# Patient Record
Sex: Male | Born: 1965 | Race: White | Hispanic: No | Marital: Married | Smoking: Former smoker
Health system: Southern US, Community
[De-identification: ages and names within clinical notes are randomized; demographics above are authoritative.]

## PROBLEM LIST (undated history)

## (undated) DIAGNOSIS — H9190 Unspecified hearing loss, unspecified ear: Secondary | ICD-10-CM

## (undated) HISTORY — DX: Unspecified hearing loss, unspecified ear: H91.90

---

## 2001-12-19 ENCOUNTER — Encounter: Payer: Self-pay | Admitting: Family Medicine

## 2001-12-19 ENCOUNTER — Encounter: Admission: RE | Admit: 2001-12-19 | Discharge: 2001-12-19 | Payer: Self-pay | Admitting: Family Medicine

## 2012-10-31 DIAGNOSIS — H9190 Unspecified hearing loss, unspecified ear: Secondary | ICD-10-CM

## 2012-10-31 HISTORY — DX: Unspecified hearing loss, unspecified ear: H91.90

## 2015-12-28 ENCOUNTER — Other Ambulatory Visit: Payer: Self-pay | Admitting: Neurosurgery

## 2015-12-28 DIAGNOSIS — M25512 Pain in left shoulder: Secondary | ICD-10-CM

## 2015-12-28 DIAGNOSIS — Z139 Encounter for screening, unspecified: Secondary | ICD-10-CM

## 2015-12-30 ENCOUNTER — Ambulatory Visit
Admission: RE | Admit: 2015-12-30 | Discharge: 2015-12-30 | Disposition: A | Discharge status: Home / Self Care | Payer: No Typology Code available for payment source | Source: Ambulatory Visit | Attending: Neurosurgery | Admitting: Neurosurgery

## 2015-12-30 ENCOUNTER — Other Ambulatory Visit: Payer: No Typology Code available for payment source

## 2015-12-30 DIAGNOSIS — M25512 Pain in left shoulder: Secondary | ICD-10-CM

## 2015-12-30 DIAGNOSIS — Z139 Encounter for screening, unspecified: Secondary | ICD-10-CM

## 2016-01-02 ENCOUNTER — Other Ambulatory Visit: Payer: Self-pay

## 2016-01-19 HISTORY — PX: SHOULDER ARTHROSCOPY: SHX128

## 2016-04-05 ENCOUNTER — Other Ambulatory Visit: Payer: Self-pay | Admitting: *Deleted

## 2016-04-05 ENCOUNTER — Encounter: Payer: Self-pay | Admitting: Family Medicine

## 2016-04-05 ENCOUNTER — Ambulatory Visit (INDEPENDENT_AMBULATORY_CARE_PROVIDER_SITE_OTHER): Payer: No Typology Code available for payment source | Admitting: Family Medicine

## 2016-04-05 VITALS — BP 132/88 | HR 96 | Temp 98.1°F | Ht 74.0 in | Wt 195.8 lb

## 2016-04-05 DIAGNOSIS — H9193 Unspecified hearing loss, bilateral: Secondary | ICD-10-CM | POA: Diagnosis not present

## 2016-04-05 DIAGNOSIS — Z1211 Encounter for screening for malignant neoplasm of colon: Secondary | ICD-10-CM

## 2016-04-05 DIAGNOSIS — K648 Other hemorrhoids: Secondary | ICD-10-CM | POA: Diagnosis not present

## 2016-04-05 DIAGNOSIS — K644 Residual hemorrhoidal skin tags: Secondary | ICD-10-CM | POA: Insufficient documentation

## 2016-04-05 DIAGNOSIS — H9190 Unspecified hearing loss, unspecified ear: Secondary | ICD-10-CM | POA: Insufficient documentation

## 2016-04-05 MED ORDER — HYDROCORTISONE 2.5 % RE CREA: 1.0000 "application " | TOPICAL_CREAM | Freq: Two times a day (BID) | RECTAL | Status: DC

## 2016-04-05 NOTE — Progress Notes (Signed)
BP 132/88 mmHg  Pulse 96  Temp(Src) 98.1 F (36.7 C)  Ht 6\' 2"  (1.88 m)  Wt 195 lb 12.8 oz (88.814 kg)  BMI 25.13 kg/m2  SpO2 98%   CC: new pt to establish  Subjective:    Patient ID: Duane Hudson, male    DOB: Apr 03, 1966, 50 y.o.   MRN: [id number]  HPI: Duane Hudson is a 50 y.o. male presenting on 04/05/2016 for Establish Care   Husband of Arsene Deshazier. No recent PCP or physical.   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   Recent shoulder arthroscopy for adhesive capsulitis L (12/2015, [place]). Takes meloxicam or ibuprofen for this.   Developing spot next to anus noticed a few weeks ago. Intermittently discomfort. H/o hemorrhoids in past, this feels similar but larger. No bleeding. Normally 1 BM QOD. Metamucil intermittently. No fevers/chills.  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 Interim medical history since our last visit reviewed. Allergies and medications reviewed and updated. No current outpatient prescriptions on file prior to visit.   No current facility-administered medications on file prior to visit.    Review of Systems Per HPI unless specifically indicated in ROS section     Objective:    BP 132/88 mmHg  Pulse 96  Temp(Src) 98.1 F (36.7 C)  Ht 6\' 2"  (1.88 m)  Wt 195 lb 12.8 oz (88.814 kg)  BMI 25.13 kg/m2  SpO2 98%  Wt Readings from Last 3 Encounters:  04/05/16 195 lb 12.8 oz (88.814 kg)    Physical Exam  Constitutional: He is oriented to person, place, and time. He appears well-developed  and well-nourished. No distress.  HENT:  Head: Normocephalic and atraumatic.  Mouth/Throat: Oropharynx is clear and moist. No oropharyngeal exudate.  Eyes: Conjunctivae and EOM are normal. Pupils are equal, round, and reactive to light. No scleral icterus.  Neck: Normal range of motion. Neck supple. No thyromegaly present.  Cardiovascular: Normal rate, regular rhythm, normal heart sounds and intact distal pulses.   No murmur heard. Pulmonary/Chest: Effort normal and breath sounds normal. No respiratory distress. He has no wheezes. He has no rales.  Genitourinary: Rectal exam shows external hemorrhoid. Rectal exam shows no fissure, no mass, no tenderness and anal tone normal.  R inferior column inflamed non thrombosed external hemorrhoid, not tender to palpation  Musculoskeletal: He exhibits no edema.  Lymphadenopathy:    He has no cervical adenopathy.  Neurological: He is alert and oriented to person, place, and time.  Skin: Skin is warm and dry. No rash noted.  Psychiatric: He has a normal mood and affect.  Nursing note and vitals reviewed.  No results found for this or any previous visit.    Assessment & Plan:   Problem List Items Addressed This Visit    Hard of hearing   External hemorrhoids without complication - Primary    Reviewed dx and treatment plan - anusol HC, sitz bath, metamucil and water/fiber in diet for goal 1 soft stool /day       Other Visit Diagnoses    Special screening for malignant  neoplasms, colon        Relevant Orders    Ambulatory referral to Gastroenterology        Follow up plan: Return in about 3 months (around 07/06/2016), or if symptoms worsen or fail to improve, for annual exam, prior fasting for blood work.  Ria Bush, MD

## 2016-04-05 NOTE — Assessment & Plan Note (Signed)
Reviewed dx and treatment plan - anusol HC, sitz bath, metamucil and water/fiber in diet for goal 1 soft stool /day

## 2016-04-05 NOTE — Patient Instructions (Addendum)
You do have external hemorrhoid - treat with anusol HC steroid cream sent to pharmacy.  Sitz bath and metamucil will also help.  Goal is 1 soft stool a day. Avoid straining or heavy lifting.   Hemorrhoids Hemorrhoids are swollen veins around the rectum or anus. There are two types of hemorrhoids:   Internal hemorrhoids. These occur in the veins just inside the rectum. They may poke through to the outside and become irritated and painful.  External hemorrhoids. These occur in the veins outside the anus and can be felt as a painful swelling or hard lump near the anus. CAUSES  Pregnancy.   Obesity.   Constipation or diarrhea.   Straining to have a bowel movement.   Sitting for long periods on the toilet.  Heavy lifting or other activity that caused you to strain.  Anal intercourse. SYMPTOMS   Pain.   Anal itching or irritation.   Rectal bleeding.   Fecal leakage.   Anal swelling.   One or more lumps around the anus.  DIAGNOSIS  Your caregiver may be able to diagnose hemorrhoids by visual examination. Other examinations or tests that may be performed include:   Examination of the rectal area with a gloved hand (digital rectal exam).   Examination of anal canal using a small tube (scope).   A blood test if you have lost a significant amount of blood.  A test to look inside the colon (sigmoidoscopy or colonoscopy). TREATMENT Most hemorrhoids can be treated at home. However, if symptoms do not seem to be getting better or if you have a lot of rectal bleeding, your caregiver may perform a procedure to help make the hemorrhoids get smaller or remove them completely. Possible treatments include:   Placing a rubber band at the base of the hemorrhoid to cut off the circulation (rubber band ligation).   Injecting a chemical to shrink the hemorrhoid (sclerotherapy).   Using a tool to burn the hemorrhoid (infrared light therapy).   Surgically removing the  hemorrhoid (hemorrhoidectomy).   Stapling the hemorrhoid to block blood flow to the tissue (hemorrhoid stapling).  HOME CARE INSTRUCTIONS   Eat foods with fiber, such as whole grains, beans, nuts, fruits, and vegetables. Ask your doctor about taking products with added fiber in them (fibersupplements).  Increase fluid intake. Drink enough water and fluids to keep your urine clear or pale yellow.   Exercise regularly.   Go to the bathroom when you have the urge to have a bowel movement. Do not wait.   Avoid straining to have bowel movements.   Keep the anal area dry and clean. Use wet toilet paper or moist towelettes after a bowel movement.   Medicated creams and suppositories may be used or applied as directed.   Only take over-the-counter or prescription medicines as directed by your caregiver.   Take warm sitz baths for 15-20 minutes, 3-4 times a day to ease pain and discomfort.   Place ice packs on the hemorrhoids if they are tender and swollen. Using ice packs between sitz baths may be helpful.   Put ice in a plastic bag.   Place a towel between your skin and the bag.   Leave the ice on for 15-20 minutes, 3-4 times a day.   Do not use a donut-shaped pillow or sit on the toilet for long periods. This increases blood pooling and pain.  SEEK MEDICAL CARE IF:  You have increasing pain and swelling that is not controlled by treatment or  medicine.  You have uncontrolled bleeding.  You have difficulty or you are unable to have a bowel movement.  You have pain or inflammation outside the area of the hemorrhoids. MAKE SURE YOU:  Understand these instructions.  Will watch your condition.  Will get help right away if you are not doing well or get worse.   This information is not intended to replace advice given to you by your health care provider. Make sure you discuss any questions you have with your health care provider.   Document Released: 10/14/2000  Document Revised: 10/03/2012 Document Reviewed: 08/21/2012 Elsevier Interactive Patient Education [employer].

## 2016-04-05 NOTE — Progress Notes (Signed)
Pre visit review using our clinic review tool, if applicable. No additional management support is needed unless otherwise documented below in the visit note. 

## 2016-04-06 ENCOUNTER — Encounter: Payer: Self-pay | Admitting: Internal Medicine

## 2016-05-30 ENCOUNTER — Ambulatory Visit (AMBULATORY_SURGERY_CENTER): Payer: Self-pay | Admitting: *Deleted

## 2016-05-30 ENCOUNTER — Encounter: Payer: Self-pay | Admitting: Internal Medicine

## 2016-05-30 VITALS — Ht 74.0 in | Wt 205.0 lb

## 2016-05-30 DIAGNOSIS — Z1211 Encounter for screening for malignant neoplasm of colon: Secondary | ICD-10-CM

## 2016-05-30 MED ORDER — NA SULFATE-K SULFATE-MG SULF 17.5-3.13-1.6 GM/177ML PO SOLN: ORAL | 0 refills | Status: DC

## 2016-05-30 NOTE — Progress Notes (Signed)
Patient denies any allergies to eggs or soy. Patient denies any problems with anesthesia/sedation. Patient denies any oxygen use at home and does not take any diet/weight loss medications.  

## 2016-05-31 HISTORY — PX: COLONOSCOPY: SHX174

## 2016-06-13 ENCOUNTER — Encounter: Payer: Self-pay | Admitting: Internal Medicine

## 2016-06-13 ENCOUNTER — Ambulatory Visit (AMBULATORY_SURGERY_CENTER): Payer: No Typology Code available for payment source | Admitting: Internal Medicine

## 2016-06-13 VITALS — BP 104/66 | HR 76 | Temp 97.5°F | Resp 20 | Ht 74.0 in | Wt 205.0 lb

## 2016-06-13 DIAGNOSIS — D125 Benign neoplasm of sigmoid colon: Secondary | ICD-10-CM | POA: Diagnosis not present

## 2016-06-13 DIAGNOSIS — Z1211 Encounter for screening for malignant neoplasm of colon: Secondary | ICD-10-CM

## 2016-06-13 DIAGNOSIS — D123 Benign neoplasm of transverse colon: Secondary | ICD-10-CM

## 2016-06-13 DIAGNOSIS — K635 Polyp of colon: Secondary | ICD-10-CM | POA: Diagnosis not present

## 2016-06-13 MED ORDER — SODIUM CHLORIDE 0.9 % IV SOLN: 500.0000 mL | INTRAVENOUS | Status: DC

## 2016-06-13 NOTE — Patient Instructions (Addendum)
Impressions/recommendations:  Polyps (handout given) Hemorrhoids (handout given)  YOU HAD AN ENDOSCOPIC PROCEDURE TODAY AT THE [facility]:   Refer to the procedure report that was given to you for any specific questions about what was found during the examination.  If the procedure report does not answer your questions, please call your gastroenterologist to clarify.  If you requested that your care partner not be given the details of your procedure findings, then the procedure report has been included in a sealed envelope for you to review at your convenience later.  YOU SHOULD EXPECT: Some feelings of bloating in the abdomen. Passage of more gas than usual.  Walking can help get rid of the air that was put into your GI tract during the procedure and reduce the bloating. If you had a lower endoscopy (such as a colonoscopy or flexible sigmoidoscopy) you may notice spotting of blood in your stool or on the toilet paper. If you underwent a bowel prep for your procedure, you may not have a normal bowel movement for a few days.  Please Note:  You might notice some irritation and congestion in your nose or some drainage.  This is from the oxygen used during your procedure.  There is no need for concern and it should clear up in a day or so.  SYMPTOMS TO REPORT IMMEDIATELY:   Following lower endoscopy (colonoscopy or flexible sigmoidoscopy):  Excessive amounts of blood in the stool  Significant tenderness or worsening of abdominal pains  Swelling of the abdomen that is new, acute  Fever of 100F or higher   For urgent or emergent issues, a gastroenterologist can be reached at any hour by calling [phone].   DIET: Your first meal following the procedure should be a small meal and then it is ok to progress to your normal diet. Heavy or fried foods are harder to digest and may make you feel nauseous or bloated.  Likewise, meals heavy in dairy and vegetables can increase bloating.   Drink plenty of fluids but you should avoid alcoholic beverages for 24 hours.  ACTIVITY:  You should plan to take it easy for the rest of today and you should NOT DRIVE or use heavy machinery until tomorrow (because of the sedation medicines used during the test).    FOLLOW UP: Our staff will call the number listed on your records the next business day following your procedure to check on you and address any questions or concerns that you may have regarding the information given to you following your procedure. If we do not reach you, we will leave a message.  However, if you are feeling well and you are not experiencing any problems, there is no need to return our call.  We will assume that you have returned to your regular daily activities without incident.  If any biopsies were taken you will be contacted by phone or by letter within the next 1-3 weeks.  Please call us at [phone] if you have not heard about the biopsies in 3 weeks.    SIGNATURES/CONFIDENTIALITY: You and/or your care partner have signed paperwork which will be entered into your electronic medical record.  These signatures attest to the fact that that the information above on your After Visit Summary has been reviewed and is understood.  Full responsibility of the confidentiality of this discharge information lies with you and/or your care-partner. 

## 2016-06-13 NOTE — Op Note (Signed)
[facility] Patient Name: Duane Hudson Procedure Date: 06/13/2016 7:38 AM MRN: [id number] Endoscopist: Jerene Bears , MD Age: 50 Referring MD:  Date of Birth: 10/04/1966 Gender: Male Account #: [id number] Procedure:                Colonoscopy Indications:              Screening for colorectal malignant neoplasm, This                            is the patient's first colonoscopy Medicines:                Monitored Anesthesia Care Procedure:                Pre-Anesthesia Assessment:                           - Prior to the procedure, a History and Physical                            was performed, and patient medications and                            allergies were reviewed. The patient's tolerance of                            previous anesthesia was also reviewed. The risks                            and benefits of the procedure and the sedation                            options and risks were discussed with the patient.                            All questions were answered, and informed consent                            was obtained. Prior Anticoagulants: The patient has                            taken no previous anticoagulant or antiplatelet                            agents. ASA Grade Assessment: II - A patient with                            mild systemic disease. After reviewing the risks                            and benefits, the patient was deemed in                            satisfactory condition to undergo the procedure.  After obtaining informed consent, the colonoscope                            was passed under direct vision. Throughout the                            procedure, the patient's blood pressure, pulse, and                            oxygen saturations were monitored continuously. The                            Model CF-HQ190L [id number]) scope was introduced                            through the anus and advanced  to the the cecum,                            identified by appendiceal orifice and ileocecal                            valve. The colonoscopy was performed without                            difficulty. The patient tolerated the procedure                            well. The quality of the bowel preparation was                            good. The ileocecal valve, appendiceal orifice, and                            rectum were photographed. Scope In: 8:11:03 AM Scope Out: 8:26:36 AM Scope Withdrawal Time: 0 hours 11 minutes 38 seconds  Total Procedure Duration: 0 hours 15 minutes 33 seconds  Findings:                 The perianal exam findings include non-thrombosed                            internal hemorrhoids.                           A 5 mm polyp was found in the hepatic flexure. The                            polyp was sessile. The polyp was removed with a                            cold snare. Resection and retrieval were complete.                           A 5 mm polyp was found in the distal sigmoid colon.  The polyp was sessile. The polyp was removed with a                            cold snare. Resection and retrieval were complete.                           Internal hemorrhoids were found during retroflexion                            and during perianal exam. The hemorrhoids were                            small. Complications:            No immediate complications. Estimated Blood Loss:     Estimated blood loss: none. Impression:               - One 5 mm polyp at the hepatic flexure, removed                            with a cold snare. Resected and retrieved.                           - One 5 mm polyp in the distal sigmoid colon,                            removed with a cold snare. Resected and retrieved.                           - Internal hemorrhoids. Recommendation:           - Patient has a contact number available for                             emergencies. The signs and symptoms of potential                            delayed complications were discussed with the                            patient. Return to normal activities tomorrow.                            Written discharge instructions were provided to the                            patient.                           - Resume previous diet.                           - Continue present medications.                           - Await pathology results.                           -  Repeat colonoscopy is recommended. The                            colonoscopy date will be determined after pathology                            results from today's exam become available for                            review. Jerene Bears, MD 06/13/2016 8:29:56 AM This report has been signed electronically.

## 2016-06-13 NOTE — Progress Notes (Signed)
Called to room to assist during endoscopic procedure.  Patient ID and intended procedure confirmed with present staff. Received instructions for my participation in the procedure from the performing physician.  

## 2016-06-14 ENCOUNTER — Telehealth: Payer: Self-pay | Admitting: *Deleted

## 2016-06-14 NOTE — Telephone Encounter (Signed)
  Follow up Call-  Call back number 06/13/2016  Post procedure Call Back phone  # [phone]  Permission to leave phone message Yes  Some recent data might be hidden     Patient questions:   Message left to call us if necessary.

## 2016-06-16 ENCOUNTER — Encounter: Payer: Self-pay | Admitting: Internal Medicine

## 2016-06-19 ENCOUNTER — Encounter: Payer: Self-pay | Admitting: Family Medicine

## 2016-07-08 ENCOUNTER — Other Ambulatory Visit: Payer: Self-pay | Admitting: Family Medicine

## 2016-07-08 ENCOUNTER — Other Ambulatory Visit (INDEPENDENT_AMBULATORY_CARE_PROVIDER_SITE_OTHER): Payer: No Typology Code available for payment source

## 2016-07-08 DIAGNOSIS — Z13220 Encounter for screening for lipoid disorders: Secondary | ICD-10-CM

## 2016-07-08 DIAGNOSIS — Z125 Encounter for screening for malignant neoplasm of prostate: Secondary | ICD-10-CM | POA: Diagnosis not present

## 2016-07-08 DIAGNOSIS — Z832 Family history of diseases of the blood and blood-forming organs and certain disorders involving the immune mechanism: Secondary | ICD-10-CM

## 2016-07-08 DIAGNOSIS — Z131 Encounter for screening for diabetes mellitus: Secondary | ICD-10-CM | POA: Diagnosis not present

## 2016-07-08 LAB — CBC WITH DIFFERENTIAL/PLATELET
Basophils Absolute: 0 10*3/uL (ref 0.0–0.1)
Basophils Relative: 0.4 % (ref 0.0–3.0)
EOS PCT: 1.4 % (ref 0.0–5.0)
Eosinophils Absolute: 0.1 10*3/uL (ref 0.0–0.7)
HEMATOCRIT: 43.1 % (ref 39.0–52.0)
HEMOGLOBIN: 14.8 g/dL (ref 13.0–17.0)
LYMPHS PCT: 27 % (ref 12.0–46.0)
Lymphs Abs: 1.7 10*3/uL (ref 0.7–4.0)
MCHC: 34.2 g/dL (ref 30.0–36.0)
MCV: 90.1 fl (ref 78.0–100.0)
MONO ABS: 0.4 10*3/uL (ref 0.1–1.0)
MONOS PCT: 6.3 % (ref 3.0–12.0)
Neutro Abs: 4.1 10*3/uL (ref 1.4–7.7)
Neutrophils Relative %: 64.9 % (ref 43.0–77.0)
Platelets: 271 10*3/uL (ref 150.0–400.0)
RBC: 4.78 Mil/uL (ref 4.22–5.81)
RDW: 13.5 % (ref 11.5–15.5)
WBC: 6.4 10*3/uL (ref 4.0–10.5)

## 2016-07-08 LAB — BASIC METABOLIC PANEL
BUN: 15 mg/dL (ref 6–23)
CALCIUM: 9.1 mg/dL (ref 8.4–10.5)
CO2: 29 meq/L (ref 19–32)
Chloride: 104 mEq/L (ref 96–112)
Creatinine, Ser: 1.23 mg/dL (ref 0.40–1.50)
GFR: 65.93 mL/min (ref >60.00)
GLUCOSE: 118 mg/dL — AB (ref 70–99)
Potassium: 4.6 mEq/L (ref 3.5–5.1)
SODIUM: 136 meq/L (ref 135–145)

## 2016-07-08 LAB — HEPATIC FUNCTION PANEL
ALBUMIN: 4.4 g/dL (ref 3.5–5.2)
ALK PHOS: 51 U/L (ref 39–117)
ALT: 17 U/L (ref 0–53)
AST: 16 U/L (ref 0–37)
Bilirubin, Direct: 0.1 mg/dL (ref 0.0–0.3)
TOTAL PROTEIN: 6.6 g/dL (ref 6.0–8.3)
Total Bilirubin: 0.7 mg/dL (ref 0.2–1.2)

## 2016-07-08 LAB — LIPID PANEL
CHOL/HDL RATIO: 4
Cholesterol: 183 mg/dL (ref 0–200)
HDL: 46.1 mg/dL (ref >39.00)
LDL CALC: 117 mg/dL — AB (ref 0–99)
NonHDL: 136.99
TRIGLYCERIDES: 98 mg/dL (ref 0.0–149.0)
VLDL: 19.6 mg/dL (ref 0.0–40.0)

## 2016-07-08 LAB — PSA: PSA: 1.23 ng/mL (ref 0.10–4.00)

## 2016-07-12 ENCOUNTER — Ambulatory Visit (INDEPENDENT_AMBULATORY_CARE_PROVIDER_SITE_OTHER): Payer: No Typology Code available for payment source | Admitting: Family Medicine

## 2016-07-12 ENCOUNTER — Encounter: Payer: Self-pay | Admitting: Family Medicine

## 2016-07-12 VITALS — BP 136/86 | HR 64 | Temp 98.1°F | Ht 74.0 in | Wt 207.2 lb

## 2016-07-12 DIAGNOSIS — Z0000 Encounter for general adult medical examination without abnormal findings: Secondary | ICD-10-CM | POA: Diagnosis not present

## 2016-07-12 DIAGNOSIS — R7303 Prediabetes: Secondary | ICD-10-CM | POA: Insufficient documentation

## 2016-07-12 DIAGNOSIS — R739 Hyperglycemia, unspecified: Secondary | ICD-10-CM | POA: Diagnosis not present

## 2016-07-12 DIAGNOSIS — Z832 Family history of diseases of the blood and blood-forming organs and certain disorders involving the immune mechanism: Secondary | ICD-10-CM | POA: Diagnosis not present

## 2016-07-12 NOTE — Assessment & Plan Note (Signed)
Consider eval for FVL disease next labs (genetic testing)

## 2016-07-12 NOTE — Progress Notes (Addendum)
BP 136/86   Pulse 64   Temp 98.1 F (36.7 C) (Oral)   Ht 6\' 2"  (1.88 m)   Wt 207 lb 4 oz (94 kg)   BMI 26.61 kg/m    CC: CPE Subjective:    Patient ID: Duane Hudson, male    DOB: October 16, 1966, 50 y.o.   MRN: [id number]  HPI: Duane Hudson is a 50 y.o. male presenting on 07/12/2016 for Annual Exam   fmhx factor V lenden - requests checked at next labs, will let me know name of disorder. Bladder and kidney cancer in dad, non smoker.  Preventative: COLONOSCOPY 05/2016    2 HPs, concern for SSP rpt 5 yrs ([facility]) Prostate cancer - discussed, would like to screen.  Flu shot - declines Tetanus shot - unsure. Declines.  Seat belt use discussed Sunscreen use discussed. No changing moles on skin. 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 Interim medical history since our last visit reviewed. Allergies and medications reviewed and updated. Current Outpatient Prescriptions on File Prior to Visit  Medication Sig  . meloxicam (MOBIC) 15 MG tablet Take 15 mg by mouth daily. As needed  . psyllium (METAMUCIL) 58.6 % packet Take 1 packet by mouth daily.  . hydrocortisone (ANUSOL-HC) 2.5 % rectal cream Place 1 application rectally 2 (two) times daily. (Patient not taking: Reported on 05/30/2016)   No current facility-administered medications on file prior to visit.     Review of Systems  Constitutional: Negative for activity change, appetite change, chills, fatigue, fever and unexpected weight change.  HENT: Negative for hearing loss.   Eyes: Negative for visual disturbance.  Respiratory: Negative for cough, chest tightness, shortness of breath and  wheezing.   Cardiovascular: Negative for chest pain, palpitations and leg swelling.  Gastrointestinal: Negative for abdominal distention, abdominal pain, blood in stool, constipation, diarrhea, nausea and vomiting.  Genitourinary: Negative for difficulty urinating and hematuria.  Musculoskeletal: Negative for arthralgias, myalgias and neck pain.  Skin: Negative for rash.  Neurological: Negative for dizziness, seizures, syncope and headaches.  Hematological: Negative for adenopathy. Does not bruise/bleed easily.  Psychiatric/Behavioral: Negative for dysphoric mood. The patient is not nervous/anxious.    Per HPI unless specifically indicated in ROS section     Objective:    BP 136/86   Pulse 64   Temp 98.1 F (36.7 C) (Oral)   Ht 6\' 2"  (1.88 m)   Wt 207 lb 4 oz (94 kg)   BMI 26.61 kg/m   Wt Readings from Last 3 Encounters:  07/12/16 207 lb 4 oz (94 kg)  06/13/16 205 lb (93 kg)  05/30/16 205 lb (93 kg)    Physical Exam  Constitutional: He is oriented to person, place, and time. He appears well-developed and well-nourished. No distress.  HENT:  Head: Normocephalic and atraumatic.  Right Ear: Hearing, tympanic membrane, external ear and ear canal normal.  Left Ear: Hearing, tympanic membrane, external ear and ear canal normal.  Nose: Nose normal.  Mouth/Throat: Uvula is midline, oropharynx is clear and moist and mucous membranes are normal. No oropharyngeal exudate, posterior oropharyngeal edema or posterior oropharyngeal erythema.  Eyes: Conjunctivae and EOM are normal. Pupils are equal, round, and reactive  to light. No scleral icterus.  Neck: Normal range of motion. Neck supple. No thyromegaly present.  Cardiovascular: Normal rate, regular rhythm, normal heart sounds and intact distal pulses.   No murmur heard. Pulses:      Radial pulses are 2+ on the right side, and 2+ on the left side.  Pulmonary/Chest: Effort normal and breath sounds normal. No respiratory distress. He has  no wheezes. He has no rales.  Abdominal: Soft. Bowel sounds are normal. He exhibits no distension and no mass. There is no tenderness. There is no rebound and no guarding.  Genitourinary: Rectum normal and prostate normal. Rectal exam shows no external hemorrhoid, no internal hemorrhoid, no fissure, no mass, no tenderness and anal tone normal. Prostate is not enlarged (20gm) and not tender.  Musculoskeletal: Normal range of motion. He exhibits no edema.  Lymphadenopathy:    He has no cervical adenopathy.  Neurological: He is alert and oriented to person, place, and time.  CN grossly intact, station and gait intact  Skin: Skin is warm and dry. No rash noted.  Psychiatric: He has a normal mood and affect. His behavior is normal. Judgment and thought content normal.  Nursing note and vitals reviewed.  Results for orders placed or performed in visit on 07/08/16  Lipid panel  Result Value Ref Range   Cholesterol 183 0 - 200 mg/dL   Triglycerides 98.0 0.0 - 149.0 mg/dL   HDL 46.10 >39.00 mg/dL   VLDL 19.6 0.0 - 40.0 mg/dL   LDL Cholesterol 117 (H) 0 - 99 mg/dL   Total CHOL/HDL Ratio 4    NonHDL 136.99   CBC with Differential/Platelet  Result Value Ref Range   WBC 6.4 4.0 - 10.5 K/uL   RBC 4.78 4.22 - 5.81 Mil/uL   Hemoglobin 14.8 13.0 - 17.0 g/dL   HCT 43.1 39.0 - 52.0 %   MCV 90.1 78.0 - 100.0 fl   MCHC 34.2 30.0 - 36.0 g/dL   RDW 13.5 11.5 - 15.5 %   Platelets 271.0 150.0 - 400.0 K/uL   Neutrophils Relative % 64.9 43.0 - 77.0 %   Lymphocytes Relative 27.0 12.0 - 46.0 %   Monocytes Relative 6.3 3.0 - 12.0 %   Eosinophils Relative 1.4 0.0 - 5.0 %   Basophils Relative 0.4 0.0 - 3.0 %   Neutro Abs 4.1 1.4 - 7.7 K/uL   Lymphs Abs 1.7 0.7 - 4.0 K/uL   Monocytes Absolute 0.4 0.1 - 1.0 K/uL   Eosinophils Absolute 0.1 0.0 - 0.7 K/uL   Basophils Absolute 0.0 0.0 - 0.1 K/uL  Basic metabolic panel  Result Value Ref Range   Sodium 136 135 - 145 mEq/L   Potassium 4.6 3.5 - 5.1 mEq/L    Chloride 104 96 - 112 mEq/L   CO2 29 19 - 32 mEq/L   Glucose, Bld 118 (H) 70 - 99 mg/dL   BUN 15 6 - 23 mg/dL   Creatinine, Ser 1.23 0.40 - 1.50 mg/dL   Calcium 9.1 8.4 - 10.5 mg/dL   GFR 65.93 >60.00 mL/min  Hepatic Function Panel  Result Value Ref Range   Total Bilirubin 0.7 0.2 - 1.2 mg/dL   Bilirubin, Direct 0.1 0.0 - 0.3 mg/dL   Alkaline Phosphatase 51 39 - 117 U/L   AST 16 0 - 37 U/L   ALT 17 0 - 53 U/L   Total Protein 6.6 6.0 - 8.3 g/dL   Albumin 4.4 3.5 - 5.2 g/dL  PSA  Result Value Ref  Range   PSA 1.23 0.10 - 4.00 ng/mL      Assessment & Plan:   Problem List Items Addressed This Visit    Family history of factor V Leiden mutation    Consider eval for FVL disease next labs (genetic testing)       Health maintenance examination - Primary    Preventative protocols reviewed and updated unless pt declined. Discussed healthy diet and lifestyle.       Hyperglycemia    Discussed healthy diet changes to affect improved glycemic control. ASCVD 18yr risk = 4.4%       Other Visit Diagnoses   None.      Follow up plan: Return in about 1 year (around 07/12/2017) for annual exam, prior fasting for blood work.  Ria Bush, MD

## 2016-07-12 NOTE — Patient Instructions (Addendum)
Work on increasing fruits/vegetables - one per day to start.  You are doing well today. Watch added sugars and sweetened beverages and simple carbs as your sugar was a bit elevated today.  Return as needed or in 1 year for next physical.  Health Maintenance, Male A healthy lifestyle and preventative care can promote health and wellness.  Maintain regular health, dental, and eye exams.  Eat a healthy diet. Foods like vegetables, fruits, whole grains, low-fat dairy products, and lean protein foods contain the nutrients you need and are low in calories. Decrease your intake of foods high in solid fats, added sugars, and salt. Get information about a proper diet from your health care provider, if necessary.  Regular physical exercise is one of the most important things you can do for your health. Most adults should get at least 150 minutes of moderate-intensity exercise (any activity that increases your heart rate and causes you to sweat) each week. In addition, most adults need muscle-strengthening exercises on 2 or more days a week.   Maintain a healthy weight. The body mass index (BMI) is a screening tool to identify possible weight problems. It provides an estimate of body fat based on height and weight. Your health care provider can find your BMI and can help you achieve or maintain a healthy weight. For males 20 years and older:  A BMI below 18.5 is considered underweight.  A BMI of 18.5 to 24.9 is normal.  A BMI of 25 to 29.9 is considered overweight.  A BMI of 30 and above is considered obese.  Maintain normal blood lipids and cholesterol by exercising and minimizing your intake of saturated fat. Eat a balanced diet with plenty of fruits and vegetables. Blood tests for lipids and cholesterol should begin at age 66 and be repeated every 5 years. If your lipid or cholesterol levels are high, you are over age 57, or you are at high risk for heart disease, you may need your cholesterol  levels checked more frequently.Ongoing high lipid and cholesterol levels should be treated with medicines if diet and exercise are not working.  If you smoke, find out from your health care provider how to quit. If you do not use tobacco, do not start.  Lung cancer screening is recommended for adults aged 70-80 years who are at high risk for developing lung cancer because of a history of smoking. A yearly low-dose CT scan of the lungs is recommended for people who have at least a 30-pack-year history of smoking and are current smokers or have quit within the past 15 years. A pack year of smoking is smoking an average of 1 pack of cigarettes a day for 1 year (for example, a 30-pack-year history of smoking could mean smoking 1 pack a day for 30 years or 2 packs a day for 15 years). Yearly screening should continue until the smoker has stopped smoking for at least 15 years. Yearly screening should be stopped for people who develop a health problem that would prevent them from having lung cancer treatment.  If you choose to drink alcohol, do not have more than 2 drinks per day. One drink is considered to be 12 oz (360 mL) of beer, 5 oz (150 mL) of wine, or 1.5 oz (45 mL) of liquor.  Avoid the use of street drugs. Do not share needles with anyone. Ask for help if you need support or instructions about stopping the use of drugs.  High blood pressure causes heart disease  and increases the risk of stroke. High blood pressure is more likely to develop in:  People who have blood pressure in the end of the normal range (100-139/85-89 mm Hg).  People who are overweight or obese.  People who are African American.  If you are 27-30 years of age, have your blood pressure checked every 3-5 years. If you are 6 years of age or older, have your blood pressure checked every year. You should have your blood pressure measured twice--once when you are at a hospital or clinic, and once when you are not at a hospital or  clinic. Record the average of the two measurements. To check your blood pressure when you are not at a hospital or clinic, you can use:  An automated blood pressure machine at a pharmacy.  A home blood pressure monitor.  If you are 49-51 years old, ask your health care provider if you should take aspirin to prevent heart disease.  Diabetes screening involves taking a blood sample to check your fasting blood sugar level. This should be done once every 3 years after age 79 if you are at a normal weight and without risk factors for diabetes. Testing should be considered at a younger age or be carried out more frequently if you are overweight and have at least 1 risk factor for diabetes.  Colorectal cancer can be detected and often prevented. Most routine colorectal cancer screening begins at the age of 36 and continues through age 102. However, your health care provider may recommend screening at an earlier age if you have risk factors for colon cancer. On a yearly basis, your health care provider may provide home test kits to check for hidden blood in the stool. A small camera at the end of a tube may be used to directly examine the colon (sigmoidoscopy or colonoscopy) to detect the earliest forms of colorectal cancer. Talk to your health care provider about this at age 85 when routine screening begins. A direct exam of the colon should be repeated every 5-10 years through age 60, unless early forms of precancerous polyps or small growths are found.  People who are at an increased risk for hepatitis B should be screened for this virus. You are considered at high risk for hepatitis B if:  You were born in a country where hepatitis B occurs often. Talk with your health care provider about which countries are considered high risk.  Your parents were born in a high-risk country and you have not received a shot to protect against hepatitis B (hepatitis B vaccine).  You have HIV or AIDS.  You use needles  to inject street drugs.  You live with, or have sex with, someone who has hepatitis B.  You are a man who has sex with other men (MSM).  You get hemodialysis treatment.  You take certain medicines for conditions like cancer, organ transplantation, and autoimmune conditions.  Hepatitis C blood testing is recommended for all people born from [birth year] through 1966 and any individual with known risk factors for hepatitis C.  Healthy men should no longer receive prostate-specific antigen (PSA) blood tests as part of routine cancer screening. Talk to your health care provider about prostate cancer screening.  Testicular cancer screening is not recommended for adolescents or adult males who have no symptoms. Screening includes self-exam, a health care provider exam, and other screening tests. Consult with your health care provider about any symptoms you have or any concerns you have about testicular cancer.  Practice safe sex. Use condoms and avoid high-risk sexual practices to reduce the spread of sexually transmitted infections (STIs).  You should be screened for STIs, including gonorrhea and chlamydia if:  You are sexually active and are younger than 24 years.  You are older than 24 years, and your health care provider tells you that you are at risk for this type of infection.  Your sexual activity has changed since you were last screened, and you are at an increased risk for chlamydia or gonorrhea. Ask your health care provider if you are at risk.  If you are at risk of being infected with HIV, it is recommended that you take a prescription medicine daily to prevent HIV infection. This is called pre-exposure prophylaxis (PrEP). You are considered at risk if:  You are a man who has sex with other men (MSM).  You are a heterosexual man who is sexually active with multiple partners.  You take drugs by injection.  You are sexually active with a partner who has HIV.  Talk with your health  care provider about whether you are at high risk of being infected with HIV. If you choose to begin PrEP, you should first be tested for HIV. You should then be tested every 3 months for as long as you are taking PrEP.  Use sunscreen. Apply sunscreen liberally and repeatedly throughout the day. You should seek shade when your shadow is shorter than you. Protect yourself by wearing long sleeves, pants, a wide-brimmed hat, and sunglasses year round whenever you are outdoors.  Tell your health care provider of new moles or changes in moles, especially if there is a change in shape or color. Also, tell your health care provider if a mole is larger than the size of a pencil eraser.  A one-time screening for abdominal aortic aneurysm (AAA) and surgical repair of large AAAs by ultrasound is recommended for men aged 35-75 years who are current or former smokers.  Stay current with your vaccines (immunizations).   This information is not intended to replace advice given to you by your health care provider. Make sure you discuss any questions you have with your health care provider.   Document Released: 04/14/2008 Document Revised: 11/07/2014 Document Reviewed: 03/14/2011 Elsevier Interactive Patient Education [employer].

## 2016-07-12 NOTE — Assessment & Plan Note (Addendum)
Discussed healthy diet changes to affect improved glycemic control. ASCVD 57yr risk = 4.4%

## 2016-07-12 NOTE — Assessment & Plan Note (Signed)
Preventative protocols reviewed and updated unless pt declined. Discussed healthy diet and lifestyle.  

## 2017-07-12 ENCOUNTER — Other Ambulatory Visit: Payer: Self-pay | Admitting: Family Medicine

## 2017-07-12 ENCOUNTER — Other Ambulatory Visit: Payer: No Typology Code available for payment source

## 2017-07-12 DIAGNOSIS — R739 Hyperglycemia, unspecified: Secondary | ICD-10-CM

## 2017-07-12 DIAGNOSIS — Z125 Encounter for screening for malignant neoplasm of prostate: Secondary | ICD-10-CM

## 2017-07-12 DIAGNOSIS — Z832 Family history of diseases of the blood and blood-forming organs and certain disorders involving the immune mechanism: Secondary | ICD-10-CM

## 2017-07-14 ENCOUNTER — Encounter: Payer: No Typology Code available for payment source | Admitting: Family Medicine

## 2017-09-06 ENCOUNTER — Other Ambulatory Visit (INDEPENDENT_AMBULATORY_CARE_PROVIDER_SITE_OTHER): Payer: No Typology Code available for payment source

## 2017-09-06 DIAGNOSIS — Z832 Family history of diseases of the blood and blood-forming organs and certain disorders involving the immune mechanism: Secondary | ICD-10-CM

## 2017-09-06 DIAGNOSIS — Z125 Encounter for screening for malignant neoplasm of prostate: Secondary | ICD-10-CM

## 2017-09-06 DIAGNOSIS — R739 Hyperglycemia, unspecified: Secondary | ICD-10-CM

## 2017-09-06 LAB — BASIC METABOLIC PANEL
BUN: 15 mg/dL (ref 6–23)
CO2: 29 mEq/L (ref 19–32)
Calcium: 9.7 mg/dL (ref 8.4–10.5)
Chloride: 102 mEq/L (ref 96–112)
Creatinine, Ser: 1.28 mg/dL (ref 0.40–1.50)
GFR: 62.68 mL/min (ref >60.00)
Glucose, Bld: 120 mg/dL — ABNORMAL HIGH (ref 70–99)
Potassium: 4.8 mEq/L (ref 3.5–5.1)
SODIUM: 137 meq/L (ref 135–145)

## 2017-09-06 LAB — PSA: PSA: 1.07 ng/mL (ref 0.10–4.00)

## 2017-09-06 LAB — LIPID PANEL
CHOL/HDL RATIO: 4
Cholesterol: 201 mg/dL — ABNORMAL HIGH (ref 0–200)
HDL: 48.1 mg/dL (ref >39.00)
LDL Cholesterol: 131 mg/dL — ABNORMAL HIGH (ref 0–99)
NonHDL: 152.59
TRIGLYCERIDES: 107 mg/dL (ref 0.0–149.0)
VLDL: 21.4 mg/dL (ref 0.0–40.0)

## 2017-09-11 LAB — FACTOR 5 LEIDEN

## 2017-09-14 ENCOUNTER — Ambulatory Visit: Payer: No Typology Code available for payment source | Admitting: Primary Care

## 2017-09-14 ENCOUNTER — Encounter: Payer: Self-pay | Admitting: Family Medicine

## 2017-09-14 ENCOUNTER — Ambulatory Visit (INDEPENDENT_AMBULATORY_CARE_PROVIDER_SITE_OTHER): Payer: No Typology Code available for payment source | Admitting: Family Medicine

## 2017-09-14 VITALS — BP 126/82 | HR 91 | Temp 97.9°F | Ht 74.0 in | Wt 217.0 lb

## 2017-09-14 DIAGNOSIS — Z0000 Encounter for general adult medical examination without abnormal findings: Secondary | ICD-10-CM | POA: Diagnosis not present

## 2017-09-14 DIAGNOSIS — E785 Hyperlipidemia, unspecified: Secondary | ICD-10-CM | POA: Diagnosis not present

## 2017-09-14 DIAGNOSIS — Z832 Family history of diseases of the blood and blood-forming organs and certain disorders involving the immune mechanism: Secondary | ICD-10-CM

## 2017-09-14 DIAGNOSIS — R739 Hyperglycemia, unspecified: Secondary | ICD-10-CM

## 2017-09-14 LAB — POC URINALSYSI DIPSTICK (AUTOMATED)
Bilirubin, UA: NEGATIVE
GLUCOSE UA: NEGATIVE
Ketones, UA: NEGATIVE
LEUKOCYTES UA: NEGATIVE
NITRITE UA: NEGATIVE
PROTEIN UA: NEGATIVE
Spec Grav, UA: 1.01 (ref 1.010–1.025)
UROBILINOGEN UA: 0.2 U/dL
pH, UA: 6 (ref 5.0–8.0)

## 2017-09-14 NOTE — Patient Instructions (Addendum)
Urinalysis today.  You are doing well today. Watch sugars, work on low cholesterol diet. Return as needed or in 1 year for next physical.  Health Maintenance, Male A healthy lifestyle and preventive care is important for your health and wellness. Ask your health care provider about what schedule of regular examinations is right for you. What should I know about weight and diet? Eat a Healthy Diet  Eat plenty of vegetables, fruits, whole grains, low-fat dairy products, and lean protein.  Do not eat a lot of foods high in solid fats, added sugars, or salt.  Maintain a Healthy Weight Regular exercise can help you achieve or maintain a healthy weight. You should:  Do at least 150 minutes of exercise each week. The exercise should increase your heart rate and make you sweat (moderate-intensity exercise).  Do strength-training exercises at least twice a week.  Watch Your Levels of Cholesterol and Blood Lipids  Have your blood tested for lipids and cholesterol every 5 years starting at 52 years of age. If you are at high risk for heart disease, you should start having your blood tested when you are 52 years old. You may need to have your cholesterol levels checked more often if: ? Your lipid or cholesterol levels are high. ? You are older than 52 years of age. ? You are at high risk for heart disease.  What should I know about cancer screening? Many types of cancers can be detected early and may often be prevented. Lung Cancer  You should be screened every year for lung cancer if: ? You are a current smoker who has smoked for at least 30 years. ? You are a former smoker who has quit within the past 15 years.  Talk to your health care provider about your screening options, when you should start screening, and how often you should be screened.  Colorectal Cancer  Routine colorectal cancer screening usually begins at 52 years of age and should be repeated every 5-10 years until you are  52 years old. You may need to be screened more often if early forms of precancerous polyps or small growths are found. Your health care provider may recommend screening at an earlier age if you have risk factors for colon cancer.  Your health care provider may recommend using home test kits to check for hidden blood in the stool.  A small camera at the end of a tube can be used to examine your colon (sigmoidoscopy or colonoscopy). This checks for the earliest forms of colorectal cancer.  Prostate and Testicular Cancer  Depending on your age and overall health, your health care provider may do certain tests to screen for prostate and testicular cancer.  Talk to your health care provider about any symptoms or concerns you have about testicular or prostate cancer.  Skin Cancer  Check your skin from head to toe regularly.  Tell your health care provider about any new moles or changes in moles, especially if: ? There is a change in a mole's size, shape, or color. ? You have a mole that is larger than a pencil eraser.  Always use sunscreen. Apply sunscreen liberally and repeat throughout the day.  Protect yourself by wearing long sleeves, pants, a wide-brimmed hat, and sunglasses when outside.  What should I know about heart disease, diabetes, and high blood pressure?  If you are 9-31 years of age, have your blood pressure checked every 3-5 years. If you are 12 years of age or older,  have your blood pressure checked every year. You should have your blood pressure measured twice-once when you are at a hospital or clinic, and once when you are not at a hospital or clinic. Record the average of the two measurements. To check your blood pressure when you are not at a hospital or clinic, you can use: ? An automated blood pressure machine at a pharmacy. ? A home blood pressure monitor.  Talk to your health care provider about your target blood pressure.  If you are between 6-21 years old, ask  your health care provider if you should take aspirin to prevent heart disease.  Have regular diabetes screenings by checking your fasting blood sugar level. ? If you are at a normal weight and have a low risk for diabetes, have this test once every three years after the age of 45. ? If you are overweight and have a high risk for diabetes, consider being tested at a younger age or more often.  A one-time screening for abdominal aortic aneurysm (AAA) by ultrasound is recommended for men aged 82-75 years who are current or former smokers. What should I know about preventing infection? Hepatitis B If you have a higher risk for hepatitis B, you should be screened for this virus. Talk with your health care provider to find out if you are at risk for hepatitis B infection. Hepatitis C Blood testing is recommended for:  Everyone born from [birth year] through 1966.  Anyone with known risk factors for hepatitis C.  Sexually Transmitted Diseases (STDs)  You should be screened each year for STDs including gonorrhea and chlamydia if: ? You are sexually active and are younger than 52 years of age. ? You are older than 52 years of age and your health care provider tells you that you are at risk for this type of infection. ? Your sexual activity has changed since you were last screened and you are at an increased risk for chlamydia or gonorrhea. Ask your health care provider if you are at risk.  Talk with your health care provider about whether you are at high risk of being infected with HIV. Your health care provider may recommend a prescription medicine to help prevent HIV infection.  What else can I do?  Schedule regular health, dental, and eye exams.  Stay current with your vaccines (immunizations).  Do not use any tobacco products, such as cigarettes, chewing tobacco, and e-cigarettes. If you need help quitting, ask your health care provider.  Limit alcohol intake to no more than 2 drinks per day.  One drink equals 12 ounces of beer, 5 ounces of wine, or 1 ounces of hard liquor.  Do not use street drugs.  Do not share needles.  Ask your health care provider for help if you need support or information about quitting drugs.  Tell your health care provider if you often feel depressed.  Tell your health care provider if you have ever been abused or do not feel safe at home. This information is not intended to replace advice given to you by your health care provider. Make sure you discuss any questions you have with your health care provider. Document Released: 04/14/2008 Document Revised: 06/15/2016 Document Reviewed: 07/21/2015 Elsevier Interactive Patient Education  [employer].

## 2017-09-14 NOTE — Addendum Note (Signed)
Addended by: Brenton Grills on: 77/08/6578 11:01 AM   Modules accepted: Orders

## 2017-09-14 NOTE — Assessment & Plan Note (Addendum)
Encouraged avoiding added sugars.  

## 2017-09-14 NOTE — Assessment & Plan Note (Addendum)
Preventative protocols reviewed and updated unless pt declined. Discussed healthy diet and lifestyle.  Check UA today given fmhx renal and bladder cancer (father)

## 2017-09-14 NOTE — Assessment & Plan Note (Signed)
Chronic. Off meds. rec low chol diet. The 10-year ASCVD risk score Mikey Bussing DC Brooke Bonito., et al., 2013) is: 4.3%   Values used to calculate the score:     Age: 51 years     Sex: Male     Is Non-Hispanic African American: No     Diabetic: No     Tobacco smoker: No     Systolic Blood Pressure: 035 mmHg     Is BP treated: No     HDL Cholesterol: 48.1 mg/dL     Total Cholesterol: 201 mg/dL

## 2017-09-14 NOTE — Progress Notes (Addendum)
BP 126/82 (BP Location: Left Arm, Patient Position: Sitting, Cuff Size: Normal)   Pulse 91   Temp 97.9 F (36.6 C) (Oral)   Ht 6\' 2"  (1.88 m)   Wt 217 lb (98.4 kg) Comment: wearing steel toe work boots  SpO2 97%   BMI 27.86 kg/m    CC: CPE Subjective:    Patient ID: Duane Hudson, male    DOB: 1966-01-02, 51 y.o.   MRN: [id number]  HPI: Duane Hudson is a 51 y.o. male presenting on 09/14/2017 for Annual Exam   fmhx bladder and kidney cancer.   Preventative: COLONOSCOPY 05/2016 - 2 HPs, concern for SSP rpt 5 yrs (Pyrtle) Prostate cancer - yearly screening Flu shot declines Tetanus shot - unsure. Declines.  Seat belt use discussed Sunscreen use discussed. No changing moles on skin. Ex smoker, quit 2007, 15 PY hx.  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 Interim medical history since our last visit reviewed. Allergies and medications reviewed and updated. Outpatient Medications Prior to Visit  Medication Sig Dispense Refill  . meloxicam (MOBIC) 15 MG tablet Take 15 mg by mouth daily. As needed    . psyllium (METAMUCIL) 58.6 % packet Take 1 packet by mouth daily.    . hydrocortisone (ANUSOL-HC) 2.5 % rectal cream Place 1 application rectally 2 (two) times daily. (Patient not taking: Reported on 05/30/2016) 30 g 0   No facility-administered medications prior to visit.      Per HPI unless specifically indicated in ROS section below Review of Systems  Constitutional: Negative for activity change, appetite change, chills, fatigue, fever and unexpected weight change.  HENT: Negative for hearing loss.   Eyes: Negative for visual disturbance.    Respiratory: Negative for cough, chest tightness, shortness of breath and wheezing.   Cardiovascular: Negative for chest pain, palpitations and leg swelling.  Gastrointestinal: Negative for abdominal distention, abdominal pain, blood in stool, constipation, diarrhea, nausea and vomiting.  Genitourinary: Negative for difficulty urinating and hematuria.  Musculoskeletal: Negative for arthralgias, myalgias and neck pain.  Skin: Negative for rash.  Neurological: Negative for dizziness, seizures, syncope and headaches.  Hematological: Negative for adenopathy. Does not bruise/bleed easily.  Psychiatric/Behavioral: Negative for dysphoric mood. The patient is not nervous/anxious.        Objective:    BP 126/82 (BP Location: Left Arm, Patient Position: Sitting, Cuff Size: Normal)   Pulse 91   Temp 97.9 F (36.6 C) (Oral)   Ht 6\' 2"  (1.88 m)   Wt 217 lb (98.4 kg) Comment: wearing steel toe work boots  SpO2 97%   BMI 27.86 kg/m   Wt Readings from Last 3 Encounters:  09/14/17 217 lb (98.4 kg)  07/12/16 207 lb 4 oz (94 kg)  06/13/16 205 lb (93 kg)    Physical Exam  Constitutional: He is oriented to person, place, and time. He appears well-developed and well-nourished. No distress.  HENT:  Head: Normocephalic and atraumatic.  Right Ear: Hearing, tympanic membrane, external ear and ear canal normal.  Left Ear: Hearing, tympanic membrane, external ear and ear canal normal.  Nose: Nose normal.  Mouth/Throat: Uvula is midline, oropharynx is clear and moist and mucous membranes are normal. No oropharyngeal exudate, posterior  oropharyngeal edema or posterior oropharyngeal erythema.  Eyes: Conjunctivae and EOM are normal. Pupils are equal, round, and reactive to light. No scleral icterus.  Neck: Normal range of motion. Neck supple. No thyromegaly present.  Cardiovascular: Normal rate, regular rhythm, normal heart sounds and intact distal pulses.  No murmur heard. Pulses:      Radial pulses are  2+ on the right side, and 2+ on the left side.  Pulmonary/Chest: Effort normal and breath sounds normal. No respiratory distress. He has no wheezes. He has no rales.  Abdominal: Soft. Bowel sounds are normal. He exhibits no distension and no mass. There is no tenderness. There is no rebound and no guarding.  Genitourinary: Rectum normal and prostate normal. Rectal exam shows no external hemorrhoid, no internal hemorrhoid, no fissure, no mass, no tenderness and anal tone normal. Prostate is not enlarged (15gm) and not tender.  Musculoskeletal: Normal range of motion. He exhibits no edema.  Lymphadenopathy:    He has no cervical adenopathy.  Neurological: He is alert and oriented to person, place, and time.  CN grossly intact, station and gait intact  Skin: Skin is warm and dry. No rash noted.  Psychiatric: He has a normal mood and affect. His behavior is normal. Judgment and thought content normal.  Nursing note and vitals reviewed.  Results for orders placed or performed in visit on 09/14/17  POCT Urinalysis Dipstick (Automated)  Result Value Ref Range   Color, UA straw    Clarity, UA clear    Glucose, UA negative    Bilirubin, UA negative    Ketones, UA negative    Spec Grav, UA 1.010 1.010 - 1.025   Blood, UA 10 Ery/uL    pH, UA 6.0 5.0 - 8.0   Protein, UA negative    Urobilinogen, UA 0.2 0.2 or 1.0 E.U./dL   Nitrite, UA negative    Leukocytes, UA Negative Negative      Assessment & Plan:   Problem List Items Addressed This Visit    Dyslipidemia    Chronic. Off meds. rec low chol diet. The 10-year ASCVD risk score Mikey Bussing DC Brooke Bonito., et al., 2013) is: 4.3%   Values used to calculate the score:     Age: 26 years     Sex: Male     Is Non-Hispanic African American: No     Diabetic: No     Tobacco smoker: No     Systolic Blood Pressure: 161 mmHg     Is BP treated: No     HDL Cholesterol: 48.1 mg/dL     Total Cholesterol: 201 mg/dL       Family history of factor V Leiden  mutation   Relevant Orders   POCT Urinalysis Dipstick (Automated) (Completed)   Health maintenance examination - Primary    Preventative protocols reviewed and updated unless pt declined. Discussed healthy diet and lifestyle.  Check UA today given fmhx renal and bladder cancer (father)      Relevant Orders   POCT Urinalysis Dipstick (Automated) (Completed)   Hyperglycemia    Encouraged avoiding added sugars.           Follow up plan: Return in about 1 year (around 09/14/2018) for annual exam, prior fasting for blood work.  Ria Bush, MD

## 2018-04-05 ENCOUNTER — Emergency Department (HOSPITAL_COMMUNITY): Payer: No Typology Code available for payment source

## 2018-04-05 ENCOUNTER — Other Ambulatory Visit: Payer: Self-pay

## 2018-04-05 ENCOUNTER — Encounter (HOSPITAL_COMMUNITY): Payer: Self-pay | Admitting: Emergency Medicine

## 2018-04-05 ENCOUNTER — Ambulatory Visit: Payer: Self-pay | Admitting: *Deleted

## 2018-04-05 ENCOUNTER — Observation Stay (HOSPITAL_COMMUNITY)
Admission: EM | Admit: 2018-04-05 | Discharge: 2018-04-06 | Disposition: A | Discharge status: Home / Self Care | Payer: No Typology Code available for payment source | Attending: Internal Medicine | Admitting: Internal Medicine

## 2018-04-05 DIAGNOSIS — R079 Chest pain, unspecified: Secondary | ICD-10-CM | POA: Diagnosis not present

## 2018-04-05 DIAGNOSIS — Z87891 Personal history of nicotine dependence: Secondary | ICD-10-CM | POA: Diagnosis not present

## 2018-04-05 DIAGNOSIS — N182 Chronic kidney disease, stage 2 (mild): Secondary | ICD-10-CM | POA: Diagnosis not present

## 2018-04-05 DIAGNOSIS — R072 Precordial pain: Secondary | ICD-10-CM | POA: Diagnosis not present

## 2018-04-05 DIAGNOSIS — H9190 Unspecified hearing loss, unspecified ear: Secondary | ICD-10-CM

## 2018-04-05 DIAGNOSIS — Z9189 Other specified personal risk factors, not elsewhere classified: Secondary | ICD-10-CM

## 2018-04-05 DIAGNOSIS — H9193 Unspecified hearing loss, bilateral: Secondary | ICD-10-CM

## 2018-04-05 DIAGNOSIS — Z79899 Other long term (current) drug therapy: Secondary | ICD-10-CM | POA: Insufficient documentation

## 2018-04-05 LAB — HEPATIC FUNCTION PANEL
ALBUMIN: 4.2 g/dL (ref 3.5–5.0)
ALT: 18 U/L (ref 17–63)
AST: 17 U/L (ref 15–41)
Alkaline Phosphatase: 40 U/L (ref 38–126)
BILIRUBIN INDIRECT: 0.8 mg/dL (ref 0.3–0.9)
BILIRUBIN TOTAL: 0.9 mg/dL (ref 0.3–1.2)
Bilirubin, Direct: 0.1 mg/dL (ref 0.1–0.5)
Total Protein: 6.8 g/dL (ref 6.5–8.1)

## 2018-04-05 LAB — CBC
HCT: 47.6 % (ref 39.0–52.0)
Hemoglobin: 16.6 g/dL (ref 13.0–17.0)
MCH: 30.6 pg (ref 26.0–34.0)
MCHC: 34.9 g/dL (ref 30.0–36.0)
MCV: 87.8 fL (ref 78.0–100.0)
PLATELETS: 293 10*3/uL (ref 150–400)
RBC: 5.42 MIL/uL (ref 4.22–5.81)
RDW: 12.3 % (ref 11.5–15.5)
WBC: 9.6 10*3/uL (ref 4.0–10.5)

## 2018-04-05 LAB — I-STAT TROPONIN, ED
TROPONIN I, POC: 0.01 ng/mL (ref 0.00–0.08)
TROPONIN I, POC: 0 ng/mL (ref 0.00–0.08)

## 2018-04-05 LAB — BASIC METABOLIC PANEL
Anion gap: 13 (ref 5–15)
BUN: 7 mg/dL (ref 6–20)
CALCIUM: 10.6 mg/dL — AB (ref 8.9–10.3)
CO2: 24 mmol/L (ref 22–32)
CREATININE: 1.28 mg/dL — AB (ref 0.61–1.24)
Chloride: 100 mmol/L — ABNORMAL LOW (ref 101–111)
GFR calc non Af Amer: >60 mL/min (ref >60)
Glucose, Bld: 103 mg/dL — ABNORMAL HIGH (ref 65–99)
Potassium: 3.5 mmol/L (ref 3.5–5.1)
Sodium: 137 mmol/L (ref 135–145)

## 2018-04-05 LAB — LIPASE, BLOOD: Lipase: 28 U/L (ref 11–51)

## 2018-04-05 MED ORDER — FAMOTIDINE IN NACL 20-0.9 MG/50ML-% IV SOLN
20.0000 mg | Freq: Once | INTRAVENOUS | Status: AC
  Administered 2018-04-05: 20 mg via INTRAVENOUS
  Filled 2018-04-05: qty 50

## 2018-04-05 MED ORDER — METOPROLOL TARTRATE 5 MG/5ML IV SOLN
5.0000 mg | Freq: Once | INTRAVENOUS | Status: AC
  Administered 2018-04-05: 5 mg via INTRAVENOUS
  Filled 2018-04-05: qty 5

## 2018-04-05 MED ORDER — ASPIRIN 81 MG PO CHEW
324.0000 mg | CHEWABLE_TABLET | Freq: Once | ORAL | Status: AC
  Administered 2018-04-05: 324 mg via ORAL
  Filled 2018-04-05: qty 4

## 2018-04-05 MED ORDER — NITROGLYCERIN 2 % TD OINT
1.0000 [in_us] | TOPICAL_OINTMENT | Freq: Four times a day (QID) | TRANSDERMAL | Status: DC
  Administered 2018-04-05: 1 [in_us] via TOPICAL
  Filled 2018-04-05: qty 1

## 2018-04-05 NOTE — ED Provider Notes (Signed)
Last night, chest pressure around 10:30 Today, returned with light activity, gen weakness, some left arm Brother with MI at 8, h/o HLD, No prior MI history Enzymes negative x2 New to cardiology Heart Score 2  REquesting cardiology consultation prior to discharge Dr. Aundra Dubin paged Dr. Johnney Killian spoke to Dr. Aundra Dubin who recommended admission to medicine with possible provacative testing tomorrow.   Discussed with Dr. Tamala Julian, [facility], who accepts for admission.   Charlann Lange, PA-C 04/06/18 0000    Charlesetta Shanks, MD 04/11/18 1640

## 2018-04-05 NOTE — Telephone Encounter (Signed)
I spoke with pt and he is enroute to [facility] ED now. FYI to Dr Darnell Level.

## 2018-04-05 NOTE — Telephone Encounter (Signed)
Patient is calling with chest pain that he has had since last night. It did seem to go away for about 30 minutes today- but it returned and has not gotten better. Patient is going to have his father drive him to the hospital for evaluation. Reason for Disposition . SEVERE chest pain  Answer Assessment - Initial Assessment Questions 1. LOCATION: "Where does it hurt?"       Center of chest at breast bone- with aching to the back 2. RADIATION: "Does the pain go anywhere else?" (e.g., into neck, jaw, arms, back)     Into back, arms have been cold tingling 3. ONSET: "When did the chest pain begin?" (Minutes, hours or days)      Last night 4. PATTERN "Does the pain come and go, or has it been constant since it started?"  "Does it get worse with exertion?"      Left for 30 minutes and started back 5. DURATION: "How long does it last" (e.g., seconds, minutes, hours)     All day long 6. SEVERITY: "How bad is the pain?"  (e.g., Scale 1-10; mild, moderate, or severe)    - MILD (1-3): doesn't interfere with normal activities     - MODERATE (4-7): interferes with normal activities or awakens from sleep    - SEVERE (8-10): excruciating pain, unable to do any normal activities       5 7. CARDIAC RISK FACTORS: "Do you have any history of heart problems or risk factors for heart disease?" (e.g., prior heart attack, angina; high blood pressure, diabetes, being overweight, high cholesterol, smoking, or strong family history of heart disease)     Brother had stint 8. PULMONARY RISK FACTORS: "Do you have any history of lung disease?"  (e.g., blood clots in lung, asthma, emphysema, birth control pills)     no 9. CAUSE: "What do you think is causing the chest pain?"     heart 10. OTHER SYMPTOMS: "Do you have any other symptoms?" (e.g., dizziness, nausea, vomiting, sweating, fever, difficulty breathing, cough)       no 11. PREGNANCY: "Is there any chance you are pregnant?" "When was your last menstrual  period?"       n/a  Protocols used: CHEST PAIN-A-AH

## 2018-04-05 NOTE — ED Notes (Signed)
Results reviewed.  No changes in acuity at this time 

## 2018-04-05 NOTE — ED Provider Notes (Addendum)
[facility] EMERGENCY DEPARTMENT Provider Note   CSN: [id number] Arrival date & time: 04/05/18  1623     History   Chief Complaint Chief Complaint  Patient presents with  . Chest Pain    HPI Duane Hudson is a 52 y.o. male.  HPI Patient reports he had onset of fairly severe chest pain last night around 1030.  Reports it was an intense pressure sensation on his anterior chest slightly more to the right than the left.  He could not get comfortable.  He tried sitting up and taking some Tums without relief.  After staying awake for several hours he did go back to sleep.  He reports this morning symptoms are somewhat improved.  He went on to do some light chores but started having chest pain again.  He reports it was in his central chest and pressure-like.  Reports he felt generally fatigued and weak.  Lower extremity swelling or calf pain.  No fever or recent cough.  Patient has very distant history of tobacco use, greater than 20 years ago.  Patient does have positive family history with her brother who had an MI in his early 10s.  Patient's father has history of Leiden factor V but the patient reports he was tested and was negative. Past Medical History:  Diagnosis Date  . Hard of hearing 2014   wears hearing aides    Patient Active Problem List   Diagnosis Date Noted  . Dyslipidemia 09/14/2017  . Health maintenance examination 07/12/2016  . Hyperglycemia 07/12/2016  . Family history of factor V Leiden mutation 07/12/2016  . External hemorrhoids without complication 58/52/7782  . Hard of hearing     Past Surgical History:  Procedure Laterality Date  . COLONOSCOPY  05/2016   2 HPs, concern for SSP rpt 5 yrs (Pyrtle)  . SHOULDER ARTHROSCOPY Left 01/19/2016   adhesive capsulitis Tamera Punt)        Home Medications    Prior to Admission medications   Medication Sig Start Date End Date Taking? Authorizing Provider  aspirin 325 MG tablet Take 325 mg by  mouth daily as needed for mild pain.   Yes [provider]  calcium carbonate (TUMS - DOSED IN MG ELEMENTAL CALCIUM) 500 MG chewable tablet Chew 1 tablet by mouth daily.   Yes [provider]  meloxicam (MOBIC) 15 MG tablet Take 15 mg by mouth daily.    Yes [provider]  psyllium (METAMUCIL) 58.6 % packet Take 1 packet by mouth daily.   Yes [provider]  simethicone (MYLICON) 80 MG chewable tablet Chew 80 mg by mouth every 6 (six) hours as needed for flatulence.   Yes [provider]    Family History Family History  Problem Relation Age of Onset  . Ovarian cancer Mother 61  . Bladder Cancer Father   . Kidney cancer Father   . Hypertension Father   . Stroke Father   . Diabetes Father   . Factor V Leiden deficiency Father   . CAD Brother 33       stent  . Diabetes Brother   . Cancer Paternal Aunt        ?ovarian  . Factor V Leiden deficiency Cousin   . Colon cancer Neg Hx     Social History Social History   Tobacco Use  . Smoking status: Former Smoker    Packs/day: 1.00    Years: 22.00    Pack years: 22.00  Start date: 11/01/1983    Last attempt to quit: 04/05/2006    Years since quitting: 12.0  . Smokeless tobacco: Former Systems developer  . Tobacco comment: PT SMOKED 1 PPD FOR ABOUT 15 YEARS  Substance Use Topics  . Alcohol use: No    Alcohol/week: 0.0 oz    Comment: lastd drink 06/26/2016 (6 pack/day prior)  . Drug use: No     Allergies   Penicillins   Review of Systems Review of Systems 10 Systems reviewed and are negative for acute change except as noted in the HPI.   Physical Exam Updated Vital Signs BP (!) 152/95   Pulse 80   Temp 98.6 F (37 C) (Oral)   Resp 15   SpO2 98%   Physical Exam  Constitutional: He is oriented to person, place, and time. He appears well-developed and well-nourished.  HENT:  Head: Normocephalic and atraumatic.  Eyes: EOM are normal.  Neck: Neck supple.  Cardiovascular: Normal  rate, regular rhythm, normal heart sounds and intact distal pulses.  Pulmonary/Chest: Effort normal and breath sounds normal.  Abdominal: Soft. Bowel sounds are normal. He exhibits no distension. There is no tenderness.  Musculoskeletal: Normal range of motion. He exhibits no edema or tenderness.  Neurological: He is alert and oriented to person, place, and time. He has normal strength. He exhibits normal muscle tone. Coordination normal. GCS eye subscore is 4. GCS verbal subscore is 5. GCS motor subscore is 6.  Skin: Skin is warm, dry and intact.  Psychiatric: He has a normal mood and affect.     ED Treatments / Results  Labs (all labs ordered are listed, but only abnormal results are displayed) Labs Reviewed  BASIC METABOLIC PANEL - Abnormal; Notable for the following components:      Result Value   Chloride 100 (*)    Glucose, Bld 103 (*)    Creatinine, Ser 1.28 (*)    Calcium 10.6 (*)    All other components within normal limits  CBC  HEPATIC FUNCTION PANEL  LIPASE, BLOOD  I-STAT TROPONIN, ED  I-STAT TROPONIN, ED    EKG EKG Interpretation  Date/Time:  Thursday April 05 2018 16:35:06 EDT Ventricular Rate:  104 PR Interval:  158 QRS Duration: 86 QT Interval:  324 QTC Calculation: 426 R Axis:   93 Text Interpretation:  Sinus tachycardia Rightward axis Borderline ECG normal. no old comparison Confirmed by Charlesetta Shanks [phone]) on 04/05/2018 9:19:45 PM   Radiology Dg Chest 2 View  Result Date: 04/05/2018 CLINICAL DATA:  Chest pain EXAM: CHEST - 2 VIEW COMPARISON:  None. FINDINGS: Lungs are clear.  No pleural effusion or pneumothorax. The heart is normal in size. Visualized osseous structures are within normal limits. IMPRESSION: Normal chest radiographs. Electronically Signed   By: Julian Hy M.D.   On: 04/05/2018 17:12    Procedures Procedures (including critical care time)  Medications Ordered in ED Medications  famotidine (PEPCID) IVPB 20 mg premix (20 mg  Intravenous New Bag/Given 04/05/18 2215)  nitroGLYCERIN (NITROGLYN) 2 % ointment 1 inch (has no administration in time range)  metoprolol tartrate (LOPRESSOR) injection 5 mg (has no administration in time range)  aspirin chewable tablet 324 mg (324 mg Oral Given 04/05/18 2215)     Initial Impression / Assessment and Plan / ED Course  I have reviewed the triage vital signs and the nursing notes.  Pertinent labs & imaging results that were available during my care of the patient were reviewed by me and considered in my  medical decision making (see chart for details).    Consult: Reviewed with Dr. Aundra Dubin cardiology.  Recommends hospitalist admission.  Final Clinical Impressions(s) / ED Diagnoses   Final diagnoses:  Precordial pain  Cardiovascular risk factor   Patient has had several episodes of significant chest pressure.  There is some association of heaviness and tingling sensation in the arms.  She has not had syncopal episode but felt extremely fatigued when this occurs.  Patient does have risk factors of sibling with MI at the same age, dyslipidemia.  At this time, plan for cardiology consultation for definitive management.  Patient also has borderline hypertension, Nitropaste applied and Lopressor 5 mg and aspirin administered.  ED Discharge Orders    None       Charlesetta Shanks, MD 04/05/18 3532    Charlesetta Shanks, MD 04/05/18 2312

## 2018-04-05 NOTE — ED Triage Notes (Signed)
Pt presents to ED for assessment of chest pain starting this morning qand constant since.  Patient c/o burning pain to mid chest, denies radiation. Pt c/o left shoulder pain wityh movement.  Denies known injury.  Pt denies SOB, or n/v.  C/o of general weakness.

## 2018-04-05 NOTE — H&P (Signed)
History and Physical    Duane Hudson DXI:[id number] DOB: 1965-11-02 DOA: 04/05/2018  Referring MD/NP/PA: Charlann Lange, PA-C PCP: Ria Bush, MD  Patient coming from:   Chief Complaint: chest pain  I have personally briefly reviewed patient's old medical records in [place]   HPI: Duane Hudson is a 52 y.o. male with medical history significant of hearing loss and CKD stage II; who presents with complaints of chest pain initially starting 6/6 at around 10:30 pm.  He is not quite sure if he woke up because he had to use the bathroom more due to pain.  However reports awakening with right upper chest wall pain that he describes as achy and pressure-like.  Initially thought symptoms were related to bed and the congestion.  Denies having any significant shortness of breath, cough, nausea, vomiting, diaphoresis, calf pain, leg swelling, or lightheadedness symptoms.  However, did complain of weakness with exertion and cold chills.  He tried taking Tums without relief of symptoms.  Patient does report traveling approximately 2 hours [place] in the last 2 weeks.  He also has a positive family history of father having Leiden factor V deficiency, and brother of MI in his early  56s.  He reportedly tested negative for the factor V deficiency in the past.    ED Course: Upon admission to the emergency department patient was noted to be afebrile, pulse 58-110, respiration rate 22, blood pressure 126/88-179/102 and O2 saturations maintained.  Labs revealed normal CBC, creatinine 1.28, and calcium 10.6.  Chest x-ray was negative for any acute abnormality.  Patient was given 325 mg of aspirin, 1 inch of 2% nitroglycerin ointment, 20 mg of Pepcid IV , metoprolol 5 mg IV.  Nitroglycerin relieved patient's chest pain complaints.  Dr. Marigene Ehlers of cardiology was consulted and recommended admission for provocative testing.   Review of Systems  Constitutional: Positive for malaise/fatigue. Negative  for chills and fever.  HENT: Negative for ear discharge and nosebleeds.   Eyes: Negative for photophobia and pain.  Respiratory: Negative for cough and shortness of breath.   Cardiovascular: Positive for chest pain. Negative for leg swelling.  Gastrointestinal: Positive for heartburn. Negative for nausea and vomiting.  Genitourinary: Negative for dysuria and hematuria.  Musculoskeletal: Negative for back pain and myalgias.  Neurological: Positive for weakness. Negative for sensory change and loss of consciousness.  Endo/Heme/Allergies: Negative for polydipsia. Does not bruise/bleed easily.  Psychiatric/Behavioral: Negative for substance abuse and suicidal ideas.    Past Medical History:  Diagnosis Date  . Hard of hearing 2014   wears hearing aides    Past Surgical History:  Procedure Laterality Date  . COLONOSCOPY  05/2016   2 HPs, concern for SSP rpt 5 yrs (Pyrtle)  . SHOULDER ARTHROSCOPY Left 01/19/2016   adhesive capsulitis Tamera Punt)     reports that he quit smoking about 12 years ago. He started smoking about 34 years ago. He has a 22.00 pack-year smoking history. He has quit using smokeless tobacco. He reports that he does not drink alcohol or use drugs.  Allergies  Allergen Reactions  . Penicillins Rash    Has patient had a PCN reaction causing immediate rash, facial/tongue/throat swelling, SOB or lightheadedness with hypotension: Yes Has patient had a PCN reaction causing severe rash involving mucus membranes or skin necrosis: Yes Has patient had a PCN reaction that required hospitalization: No Has patient had a PCN reaction occurring within the last 10 years: No If all of the above answers are "NO",  then may proceed with Cephalosporin use.     Family History  Problem Relation Age of Onset  . Ovarian cancer Mother 23  . Bladder Cancer Father   . Kidney cancer Father   . Hypertension Father   . Stroke Father   . Diabetes Father   . Factor V Leiden deficiency  Father   . CAD Brother 65       stent  . Diabetes Brother   . Cancer Paternal Aunt        ?ovarian  . Factor V Leiden deficiency Cousin   . Colon cancer Neg Hx     Prior to Admission medications   Medication Sig Start Date End Date Taking? Authorizing Provider  aspirin 325 MG tablet Take 325 mg by mouth daily as needed for mild pain.   Yes [provider]  calcium carbonate (TUMS - DOSED IN MG ELEMENTAL CALCIUM) 500 MG chewable tablet Chew 1 tablet by mouth daily.   Yes [provider]  meloxicam (MOBIC) 15 MG tablet Take 15 mg by mouth daily.    Yes [provider]  psyllium (METAMUCIL) 58.6 % packet Take 1 packet by mouth daily.   Yes [provider]  simethicone (MYLICON) 80 MG chewable tablet Chew 80 mg by mouth every 6 (six) hours as needed for flatulence.   Yes [provider]    Physical Exam:  Constitutional: NAD, calm, comfortable Vitals:   04/05/18 2230 04/05/18 2245 04/05/18 2300 04/05/18 2315  BP: (!) 148/101 (!) 154/104 (!) 140/93 (!) 139/97  Pulse: 81 84 (!) 58 68  Resp: 19 14 13  (!) 9  Temp:      TempSrc:      SpO2: 97% 99% 96% 97%   Eyes: PERRL, lids and conjunctivae normal ENMT: Mucous membranes are moist. Posterior pharynx clear of any exudate or lesions. Normal dentition.  Hearing aids in place. Neck: normal, supple, no masses, no thyromegaly Respiratory: clear to auscultation bilaterally, no wheezing, no crackles. Normal respiratory effort. No accessory muscle use.  Cardiovascular: Regular rate and rhythm, no murmurs / rubs / gallops. No extremity edema. 2+ pedal pulses. No carotid bruits.  Abdomen: no tenderness, no masses palpated. No hepatosplenomegaly. Bowel sounds positive.  Musculoskeletal: no clubbing / cyanosis. No joint deformity upper and lower extremities. Good ROM, no contractures. Normal muscle tone.  Skin: no rashes, lesions, ulcers. No induration Neurologic: CN 2-12 grossly intact. Sensation intact,  DTR normal. Strength 5/5 in all 4.  Psychiatric: Normal judgment and insight. Alert and oriented x 3. Normal mood.     Labs on Admission: I have personally reviewed following labs and imaging studies  CBC: Recent Labs  Lab 04/05/18 1643  WBC 9.6  HGB 16.6  HCT 47.6  MCV 87.8  PLT 962   Basic Metabolic Panel: Recent Labs  Lab 04/05/18 1643  NA 137  K 3.5  CL 100*  CO2 24  GLUCOSE 103*  BUN 7  CREATININE 1.28*  CALCIUM 10.6*   GFR: CrCl cannot be calculated (Unknown ideal weight.). Liver Function Tests: Recent Labs  Lab 04/05/18 2239  AST 17  ALT 18  ALKPHOS 40  BILITOT 0.9  PROT 6.8  ALBUMIN 4.2   Recent Labs  Lab 04/05/18 2239  LIPASE 28   No results for input(s): AMMONIA in the last 168 hours. Coagulation Profile: No results for input(s): INR, PROTIME in the last 168 hours. Cardiac Enzymes: No results for input(s): CKTOTAL, CKMB, CKMBINDEX, TROPONINI in the last 168 hours. BNP (  last 3 results) No results for input(s): PROBNP in the last 8760 hours. HbA1C: No results for input(s): HGBA1C in the last 72 hours. CBG: No results for input(s): GLUCAP in the last 168 hours. Lipid Profile: No results for input(s): CHOL, HDL, LDLCALC, TRIG, CHOLHDL, LDLDIRECT in the last 72 hours. Thyroid Function Tests: No results for input(s): TSH, T4TOTAL, FREET4, T3FREE, THYROIDAB in the last 72 hours. Anemia Panel: No results for input(s): VITAMINB12, FOLATE, FERRITIN, TIBC, IRON, RETICCTPCT in the last 72 hours. Urine analysis:    Component Value Date/Time   BILIRUBINUR negative 09/14/2017 1059   PROTEINUR negative 09/14/2017 1059   UROBILINOGEN 0.2 09/14/2017 1059   NITRITE negative 09/14/2017 1059   LEUKOCYTESUR Negative 09/14/2017 1059   Sepsis Labs: No results found for this or any previous visit (from the past 240 hour(s)).   Radiological Exams on Admission: Dg Chest 2 View  Result Date: 04/05/2018 CLINICAL DATA:  Chest pain EXAM: CHEST - 2 VIEW  COMPARISON:  None. FINDINGS: Lungs are clear.  No pleural effusion or pneumothorax. The heart is normal in size. Visualized osseous structures are within normal limits. IMPRESSION: Normal chest radiographs. Electronically Signed   By: Julian Hy M.D.   On: 04/05/2018 17:12    EKG: Independently reviewed.  Sinus tachycardia 104 bpm  Assessment/Plan Chest pain: Acute.  Presents with acute onset chest pain.  Heart score is 2.  Initial troponins negative x2.  Dr. Aundra Dubin of cardiology consult and recommended admission for possible need of stress testing in a.m. - Admit to a telemetry bed - Chest pain order set initiated  - N.p.o. for possible need of testing in a.m. - Check d-dimer, will check CT angiogram of chest if positive - trend cardiac troponins overnight - Check urine drug screen - Morphine IV as needed for chest pain - Continue aspirin - Initiate cardiology consultative services, follow-up further recommendation  Hypercalcemia: Acute.  Initial calcium noted to be mildly elevated at 10.6.  Patient taking Tums. - Hold Tums  - Gentle IV fluids of normal saline at 75 mL/h overnight  History of hearing loss: Patient wears hearing aids  Chronic kidney disease stage II: Creatinine noted to be mildly elevated at 1.28 on admission, but appears similar to previous.  DVT prophylaxis: Lovenox  Code Status: Full Family Communication: No family present bedside Disposition Plan: Likely discharge home if work-up negative Consults called: Cardiology Admission status: Observation  Norval Morton MD [facility] Pager [phone]   If 7PM-7AM, please contact night-coverage [email or web address] Password [email or web address]  04/05/2018, 11:46 PM

## 2018-04-06 ENCOUNTER — Observation Stay (HOSPITAL_BASED_OUTPATIENT_CLINIC_OR_DEPARTMENT_OTHER): Payer: No Typology Code available for payment source

## 2018-04-06 ENCOUNTER — Observation Stay (HOSPITAL_COMMUNITY): Payer: No Typology Code available for payment source

## 2018-04-06 DIAGNOSIS — R079 Chest pain, unspecified: Secondary | ICD-10-CM | POA: Diagnosis not present

## 2018-04-06 DIAGNOSIS — N182 Chronic kidney disease, stage 2 (mild): Secondary | ICD-10-CM | POA: Diagnosis present

## 2018-04-06 DIAGNOSIS — R072 Precordial pain: Secondary | ICD-10-CM

## 2018-04-06 LAB — LIPID PANEL
CHOLESTEROL: 159 mg/dL (ref 0–200)
HDL: 47 mg/dL (ref >40)
LDL Cholesterol: 92 mg/dL (ref 0–99)
TRIGLYCERIDES: 100 mg/dL (ref <150)
Total CHOL/HDL Ratio: 3.4 RATIO
VLDL: 20 mg/dL (ref 0–40)

## 2018-04-06 LAB — CBC WITH DIFFERENTIAL/PLATELET
Abs Immature Granulocytes: 0 10*3/uL (ref 0.0–0.1)
BASOS ABS: 0 10*3/uL (ref 0.0–0.1)
Basophils Relative: 0 %
EOS PCT: 2 %
Eosinophils Absolute: 0.2 10*3/uL (ref 0.0–0.7)
HCT: 42.3 % (ref 39.0–52.0)
HEMOGLOBIN: 14.6 g/dL (ref 13.0–17.0)
Immature Granulocytes: 0 %
LYMPHS PCT: 36 %
Lymphs Abs: 2.7 10*3/uL (ref 0.7–4.0)
MCH: 30.3 pg (ref 26.0–34.0)
MCHC: 34.5 g/dL (ref 30.0–36.0)
MCV: 87.8 fL (ref 78.0–100.0)
Monocytes Absolute: 0.7 10*3/uL (ref 0.1–1.0)
Monocytes Relative: 9 %
NEUTROS ABS: 3.9 10*3/uL (ref 1.7–7.7)
Neutrophils Relative %: 53 %
PLATELETS: 246 10*3/uL (ref 150–400)
RBC: 4.82 MIL/uL (ref 4.22–5.81)
RDW: 12.4 % (ref 11.5–15.5)
WBC: 7.5 10*3/uL (ref 4.0–10.5)

## 2018-04-06 LAB — BASIC METABOLIC PANEL
Anion gap: 10 (ref 5–15)
BUN: 9 mg/dL (ref 6–20)
CALCIUM: 9.4 mg/dL (ref 8.9–10.3)
CO2: 26 mmol/L (ref 22–32)
CREATININE: 1.38 mg/dL — AB (ref 0.61–1.24)
Chloride: 103 mmol/L (ref 101–111)
GFR, EST NON AFRICAN AMERICAN: 57 mL/min — AB (ref >60)
Glucose, Bld: 113 mg/dL — ABNORMAL HIGH (ref 65–99)
Potassium: 4 mmol/L (ref 3.5–5.1)
SODIUM: 139 mmol/L (ref 135–145)

## 2018-04-06 LAB — D-DIMER, QUANTITATIVE: D-Dimer, Quant: <0.27 ug/mL-FEU (ref 0.00–0.50)

## 2018-04-06 LAB — RAPID URINE DRUG SCREEN, HOSP PERFORMED
Amphetamines: NOT DETECTED
BARBITURATES: NOT DETECTED
Benzodiazepines: NOT DETECTED
COCAINE: NOT DETECTED
OPIATES: NOT DETECTED
Tetrahydrocannabinol: NOT DETECTED

## 2018-04-06 LAB — TROPONIN I
Troponin I: <0.03 ng/mL (ref <0.03)
Troponin I: <0.03 ng/mL (ref <0.03)

## 2018-04-06 LAB — ECHOCARDIOGRAM COMPLETE

## 2018-04-06 LAB — HIV ANTIBODY (ROUTINE TESTING W REFLEX): HIV Screen 4th Generation wRfx: NONREACTIVE

## 2018-04-06 MED ORDER — GI COCKTAIL ~~LOC~~: 30.0000 mL | Freq: Four times a day (QID) | ORAL | Status: DC | PRN

## 2018-04-06 MED ORDER — ENOXAPARIN SODIUM 40 MG/0.4ML ~~LOC~~ SOLN
40.0000 mg | Freq: Every day | SUBCUTANEOUS | Status: DC
  Filled 2018-04-06: qty 0.4

## 2018-04-06 MED ORDER — SODIUM CHLORIDE 0.9 % IV SOLN
INTRAVENOUS | Status: DC
  Administered 2018-04-06: 01:00:00 via INTRAVENOUS

## 2018-04-06 MED ORDER — PANTOPRAZOLE SODIUM 40 MG PO TBEC: 40.0000 mg | DELAYED_RELEASE_TABLET | Freq: Every day | ORAL | 0 refills | Status: DC

## 2018-04-06 MED ORDER — PANTOPRAZOLE SODIUM 40 MG PO TBEC
40.0000 mg | DELAYED_RELEASE_TABLET | Freq: Every day | ORAL | Status: DC
  Administered 2018-04-06: 40 mg via ORAL
  Filled 2018-04-06: qty 1

## 2018-04-06 MED ORDER — ONDANSETRON HCL 4 MG/2ML IJ SOLN: 4.0000 mg | Freq: Four times a day (QID) | INTRAMUSCULAR | Status: DC | PRN

## 2018-04-06 MED ORDER — MORPHINE SULFATE (PF) 4 MG/ML IV SOLN: 2.0000 mg | INTRAVENOUS | Status: DC | PRN

## 2018-04-06 MED ORDER — TECHNETIUM TC 99M TETROFOSMIN IV KIT
30.0000 | PACK | Freq: Once | INTRAVENOUS | Status: AC | PRN
  Administered 2018-04-06: 30 via INTRAVENOUS

## 2018-04-06 MED ORDER — TECHNETIUM TC 99M TETROFOSMIN IV KIT
10.0000 | PACK | Freq: Once | INTRAVENOUS | Status: AC | PRN
  Administered 2018-04-06: 10 via INTRAVENOUS

## 2018-04-06 MED ORDER — ACETAMINOPHEN 325 MG PO TABS: 650.0000 mg | ORAL_TABLET | ORAL | Status: DC | PRN

## 2018-04-06 NOTE — ED Notes (Signed)
Lunch tray ordered 

## 2018-04-06 NOTE — Progress Notes (Signed)
  Echocardiogram 2D Echocardiogram has been performed.  Bobbye Charleston 04/06/2018, 10:17 AM

## 2018-04-06 NOTE — Discharge Instructions (Signed)
Chest Wall Pain °Chest wall pain is pain in or around the bones and muscles of your chest. Sometimes, an injury causes this pain. Sometimes, the cause may not be known. This pain may take several weeks or longer to get better. °Follow these instructions at home: °Pay attention to any changes in your symptoms. Take these actions to help with your pain: °· Rest as told by your health care provider. °· Avoid activities that cause pain. These include any activities that use your chest muscles or your abdominal and side muscles to lift heavy items. °· If directed, apply ice to the painful area: °? Put ice in a plastic bag. °? Place a towel between your skin and the bag. °? Leave the ice on for 20 minutes, 2-3 times per day. °· Take over-the-counter and prescription medicines only as told by your health care provider. °· Do not use tobacco products, including cigarettes, chewing tobacco, and e-cigarettes. If you need help quitting, ask your health care provider. °· Keep all follow-up visits as told by your health care provider. This is important. ° °Contact a health care provider if: °· You have a fever. °· Your chest pain becomes worse. °· You have new symptoms. °Get help right away if: °· You have nausea or vomiting. °· You feel sweaty or light-headed. °· You have a cough with phlegm (sputum) or you cough up blood. °· You develop shortness of breath. °This information is not intended to replace advice given to you by your health care provider. Make sure you discuss any questions you have with your health care provider. °Document Released: 10/17/2005 Document Revised: 02/25/2016 Document Reviewed: 01/12/2015 °[employer]vier Interactive Patient Education © 2018 [employer] ° °

## 2018-04-06 NOTE — Consult Note (Signed)
Reason for Consult: chest pain Referring Physician:[facility]  Duane Hudson is an 52 y.o. male.  Duane Hudson is 52 year old male with past medical history significant for hearing problems, positive family history of coronary artery disease brother had MI at age of 73 requiring PTCA stenting, complained of right-sided chest pain which started about 2 days ago at 10:30 PM while in bed and next morning had retrosternal burning chest pain without associated symptoms. Patient also gives history of exertional dyspnea and states lately he feels easily fatigued and tired he did denies any cardiac workup in the past. EKG done in the ED showed no acute ischemic changes 3 sets of cardiac enzymes have been negative.  Past Medical History:  Diagnosis Date  . Hard of hearing 2014   wears hearing aides    Past Surgical History:  Procedure Laterality Date  . COLONOSCOPY  05/2016   2 HPs, concern for SSP rpt 5 yrs (Pyrtle)  . SHOULDER ARTHROSCOPY Left 01/19/2016   adhesive capsulitis Tamera Punt)    Family History  Problem Relation Age of Onset  . Ovarian cancer Mother 35  . Bladder Cancer Father   . Kidney cancer Father   . Hypertension Father   . Stroke Father   . Diabetes Father   . Factor V Leiden deficiency Father   . CAD Brother 73       stent  . Diabetes Brother   . Cancer Paternal Aunt        ?ovarian  . Factor V Leiden deficiency Cousin   . Colon cancer Neg Hx     Social History:  reports that he quit smoking about 12 years ago. He started smoking about 34 years ago. He has a 22.00 pack-year smoking history. He has quit using smokeless tobacco. He reports that he does not drink alcohol or use drugs.  Allergies:  Allergies  Allergen Reactions  . Penicillins Rash    Has patient had a PCN reaction causing immediate rash, facial/tongue/throat swelling, SOB or lightheadedness with hypotension: Yes Has patient had a PCN reaction causing severe rash involving mucus membranes or  skin necrosis: Yes Has patient had a PCN reaction that required hospitalization: No Has patient had a PCN reaction occurring within the last 10 years: No If all of the above answers are "NO", then may proceed with Cephalosporin use.     Medications: I have reviewed the patient's current medications.  Results for orders placed or performed during the hospital encounter of 04/05/18 (from the past 48 hour(s))  Basic metabolic panel     Status: Abnormal   Collection Time: 04/05/18  4:43 PM  Result Value Ref Range   Sodium 137 135 - 145 mmol/L   Potassium 3.5 3.5 - 5.1 mmol/L   Chloride 100 (L) 101 - 111 mmol/L   CO2 24 22 - 32 mmol/L   Glucose, Bld 103 (H) 65 - 99 mg/dL   BUN 7 6 - 20 mg/dL   Creatinine, Ser 1.28 (H) 0.61 - 1.24 mg/dL   Calcium 10.6 (H) 8.9 - 10.3 mg/dL   GFR calc non Af Amer >60 >60 mL/min   GFR calc Af Amer >60 >60 mL/min    Comment: (NOTE) The eGFR has been calculated using the CKD EPI equation. This calculation has not been validated in all clinical situations. eGFR's persistently <60 mL/min signify possible Chronic Kidney Disease.    Anion gap 13 5 - 15    Comment: Performed at [facility] [address],  [place], [place] [address]  CBC     Status: None   Collection Time: 04/05/18  4:43 PM  Result Value Ref Range   WBC 9.6 4.0 - 10.5 K/uL   RBC 5.42 4.22 - 5.81 MIL/uL   Hemoglobin 16.6 13.0 - 17.0 g/dL   HCT 47.6 39.0 - 52.0 %   MCV 87.8 78.0 - 100.0 fL   MCH 30.6 26.0 - 34.0 pg   MCHC 34.9 30.0 - 36.0 g/dL   RDW 12.3 11.5 - 15.5 %   Platelets 293 150 - 400 K/uL    Comment: Performed at [facility] [address], [place], [place] [address]  I-stat troponin, ED     Status: None   Collection Time: 04/05/18  4:59 PM  Result Value Ref Range   Troponin i, poc 0.01 0.00 - 0.08 ng/mL   Comment 3            Comment: Due to the release kinetics of cTnI, a negative result within the first hours of the onset of symptoms does not rule  out myocardial infarction with certainty. If myocardial infarction is still suspected, repeat the test at appropriate intervals.   I-Stat Troponin, ED (not at [facility])     Status: None   Collection Time: 04/05/18 10:27 PM  Result Value Ref Range   Troponin i, poc 0.00 0.00 - 0.08 ng/mL   Comment 3            Comment: Due to the release kinetics of cTnI, a negative result within the first hours of the onset of symptoms does not rule out myocardial infarction with certainty. If myocardial infarction is still suspected, repeat the test at appropriate intervals.   Hepatic function panel     Status: None   Collection Time: 04/05/18 10:39 PM  Result Value Ref Range   Total Protein 6.8 6.5 - 8.1 g/dL   Albumin 4.2 3.5 - 5.0 g/dL   AST 17 15 - 41 U/L   ALT 18 17 - 63 U/L   Alkaline Phosphatase 40 38 - 126 U/L   Total Bilirubin 0.9 0.3 - 1.2 mg/dL   Bilirubin, Direct 0.1 0.1 - 0.5 mg/dL   Indirect Bilirubin 0.8 0.3 - 0.9 mg/dL    Comment: Performed at [facility] [address], [place], [place] [address]  Lipase, blood     Status: None   Collection Time: 04/05/18 10:39 PM  Result Value Ref Range   Lipase 28 11 - 51 U/L    Comment: Performed at [facility], [place] [address], [place], [place] [address]  D-dimer, quantitative (not at [facility])     Status: None   Collection Time: 04/06/18 12:05 AM  Result Value Ref Range   D-Dimer, Quant <0.27 0.00 - 0.50 ug/mL-FEU    Comment: (NOTE) At the manufacturer cut-off of 0.50 ug/mL FEU, this assay has been documented to exclude PE with a sensitivity and negative predictive value of 97 to 99%.  At this time, this assay has not been approved by the FDA to exclude DVT/VTE. Results should be correlated with clinical presentation. Performed at [facility], [place] [address], [place], [place] [address]   Troponin I-serum (0, 3, 6 hours)     Status: None   Collection Time: 04/06/18 12:37 AM  Result Value Ref Range   Troponin I <0.03  <0.03 ng/mL    Comment: Performed at [facility] [address] [place], [place] [address]  Troponin I-serum (0, 3, 6 hours)     Status: None   Collection Time: 04/06/18  4:25 AM  Result Value Ref Range   Troponin I <0.03 <0.03 ng/mL    Comment: Performed at [facility] [address], [place], [place] [address]  CBC with Differential/Platelet     Status: None   Collection Time: 04/06/18  4:25 AM  Result Value Ref Range   WBC 7.5 4.0 - 10.5 K/uL   RBC 4.82 4.22 - 5.81 MIL/uL   Hemoglobin 14.6 13.0 - 17.0 g/dL   HCT 42.3 39.0 - 52.0 %   MCV 87.8 78.0 - 100.0 fL   MCH 30.3 26.0 - 34.0 pg   MCHC 34.5 30.0 - 36.0 g/dL   RDW 12.4 11.5 - 15.5 %   Platelets 246 150 - 400 K/uL   Neutrophils Relative % 53 %   Neutro Abs 3.9 1.7 - 7.7 K/uL   Lymphocytes Relative 36 %   Lymphs Abs 2.7 0.7 - 4.0 K/uL   Monocytes Relative 9 %   Monocytes Absolute 0.7 0.1 - 1.0 K/uL   Eosinophils Relative 2 %   Eosinophils Absolute 0.2 0.0 - 0.7 K/uL   Basophils Relative 0 %   Basophils Absolute 0.0 0.0 - 0.1 K/uL   Immature Granulocytes 0 %   Abs Immature Granulocytes 0.0 0.0 - 0.1 K/uL    Comment: Performed at [facility], [address], [place], [place] [address]  Basic metabolic panel     Status: Abnormal   Collection Time: 04/06/18  4:25 AM  Result Value Ref Range   Sodium 139 135 - 145 mmol/L   Potassium 4.0 3.5 - 5.1 mmol/L   Chloride 103 101 - 111 mmol/L   CO2 26 22 - 32 mmol/L   Glucose, Bld 113 (H) 65 - 99 mg/dL   BUN 9 6 - 20 mg/dL   Creatinine, Ser 1.38 (H) 0.61 - 1.24 mg/dL   Calcium 9.4 8.9 - 10.3 mg/dL   GFR calc non Af Amer 57 (L) >60 mL/min   GFR calc Af Amer >60 >60 mL/min    Comment: (NOTE) The eGFR has been calculated using the CKD EPI equation. This calculation has not been validated in all clinical situations. eGFR's persistently <60 mL/min signify possible Chronic Kidney Disease.    Anion gap 10 5 - 15    Comment: Performed at [facility] [address], [place], [place] [address]  Lipid panel     Status: None   Collection Time: 04/06/18  4:25 AM  Result Value Ref Range   Cholesterol 159 0 - 200 mg/dL   Triglycerides 100 <150 mg/dL   HDL 47 >40 mg/dL   Total CHOL/HDL Ratio 3.4 RATIO   VLDL 20 0 - 40 mg/dL   LDL Cholesterol 92 0 - 99 mg/dL    Comment:        Total Cholesterol/HDL:CHD Risk Coronary Heart Disease Risk Table                     Men   Women  1/2 Average Risk   3.4   3.3  Average Risk       5.0   4.4  2 X Average Risk   9.6   7.1  3 X Average Risk  23.4   11.0        Use the calculated Patient Ratio above and the CHD Risk Table to determine the patient's  CHD Risk.        ATP III CLASSIFICATION (LDL):  <100     mg/dL   Optimal  100-129  mg/dL   Near or Above                    Optimal  130-159  mg/dL   Borderline  160-189  mg/dL   High  >190     mg/dL   Very High Performed at [facility] [address], [place], [place] [address]   Urine rapid drug screen (hosp performed)     Status: None   Collection Time: 04/06/18  6:25 AM  Result Value Ref Range   Opiates NONE DETECTED NONE DETECTED   Cocaine NONE DETECTED NONE DETECTED   Benzodiazepines NONE DETECTED NONE DETECTED   Amphetamines NONE DETECTED NONE DETECTED   Tetrahydrocannabinol NONE DETECTED NONE DETECTED   Barbiturates NONE DETECTED NONE DETECTED    Comment: (NOTE) DRUG SCREEN FOR MEDICAL PURPOSES ONLY.  IF CONFIRMATION IS NEEDED FOR ANY PURPOSE, NOTIFY LAB WITHIN 5 DAYS. LOWEST DETECTABLE LIMITS FOR URINE DRUG SCREEN Drug Class                     Cutoff (ng/mL) Amphetamine and metabolites    1000 Barbiturate and metabolites    200 Benzodiazepine                 765 Tricyclics and metabolites     300 Opiates and metabolites        300 Cocaine and metabolites        300 THC                            50 Performed at [facility], [place] [address], [place], [place] [address]     Dg Chest 2 View  Result Date:  04/05/2018 CLINICAL DATA:  Chest pain EXAM: CHEST - 2 VIEW COMPARISON:  None. FINDINGS: Lungs are clear.  No pleural effusion or pneumothorax. The heart is normal in size. Visualized osseous structures are within normal limits. IMPRESSION: Normal chest radiographs. Electronically Signed   By: Julian Hy M.D.   On: 04/05/2018 17:12    Review of Systems  Constitutional: Negative for chills and fever.  Eyes: Negative for blurred vision.  Respiratory: Negative for cough and sputum production.   Cardiovascular: Positive for chest pain.  Gastrointestinal: Negative for abdominal pain.  Genitourinary: Negative for dysuria.  Neurological: Negative for dizziness.   Blood pressure 131/88, pulse 82, temperature 98.6 F (37 C), temperature source Oral, resp. rate 17, SpO2 98 %. Physical Exam  Constitutional: He is oriented to person, place, and time.  HENT:  Head: Normocephalic and atraumatic.  Eyes: Pupils are equal, round, and reactive to light. Conjunctivae are normal. Left eye exhibits no discharge. No scleral icterus.  Neck: Normal range of motion. Neck supple. No JVD present. No tracheal deviation present. No thyromegaly present.  Cardiovascular: Normal rate and regular rhythm. Exam reveals no gallop.  No murmur heard. Respiratory: Effort normal and breath sounds normal. No respiratory distress. He has no wheezes. He has no rales.  GI: Soft. Bowel sounds are normal. He exhibits no distension. There is no tenderness. There is no rebound.  Musculoskeletal: He exhibits no edema, tenderness or deformity.  Neurological: He is alert and oriented to person, place, and time.    Assessment/Plan: Atypical chest pain with some features worrisome for angina  MI ruled out New-onset hypertension Positive family history of coronary artery disease Hearing loss Chronic kidney disease stage II Plan Agree with present management Patient is nothing by mouth till now will schedule him for a stress  Myoview today  Charolette Forward 04/06/2018, 11:10 AM

## 2018-04-06 NOTE — ED Notes (Signed)
Pt in room and alert, no family @ bedside currently Call light within reach Vs documented

## 2018-04-06 NOTE — Discharge Summary (Signed)
Physician Discharge Summary  Duane Hudson OEV:[id number] DOB: 08-Apr-1966 DOA: 04/05/2018  PCP: Ria Bush, MD  Admit date: 04/05/2018 Discharge date: 04/06/2018  Admitted From: Home Disposition:  Home  Recommendations for Outpatient Follow-up:  1. Follow up with PCP in 2-3 weeks 2. Follow up with Dr. Terrence Dupont as needed  Discharge Condition:Stable* CODE STATUS:Full Diet recommendation: Regular   Brief/Interim Summary: 52 y.o. male with medical history significant of hearing loss and CKD stage II; who presents with complaints of chest pain initially starting 6/6 at around 10:30 pm.  He is not quite sure if he woke up because he had to use the bathroom more due to pain.  However reports awakening with right upper chest wall pain that he describes as achy and pressure-like.  Initially thought symptoms were related to bed and the congestion.  Denies having any significant shortness of breath, cough, nausea, vomiting, diaphoresis, calf pain, leg swelling, or lightheadedness symptoms.  However, did complain of weakness with exertion and cold chills.  He tried taking Tums without relief of symptoms.  Patient does report traveling approximately 2 hours [place] in the last 2 weeks.  He also has a positive family history of father having Leiden factor V deficiency, and brother of MI in his early  64s.  He reportedly tested negative for the factor V deficiency in the past.  ED Course: Upon admission to the emergency department patient was noted to be afebrile, pulse 58-110, respiration rate 22, blood pressure 126/88-179/102 and O2 saturations maintained.  Labs revealed normal CBC, creatinine 1.28, and calcium 10.6.  Chest x-ray was negative for any acute abnormality.  Patient was given 325 mg of aspirin, 1 inch of 2% nitroglycerin ointment, 20 mg of Pepcid IV , metoprolol 5 mg IV.  Nitroglycerin relieved patient's chest pain complaints.  Dr. Marigene Ehlers of cardiology was consulted and recommended  admission for provocative testing.  Chest pain:  -Patient presented with a heart score of 2 -Patient observed overnight.  Serial troponins negative, initial EKG unremarkable -Consulted cardiology.  Patient underwent nuclear stress test on 04/06/2018 which is found to be low risk study, no reversible ischemia. -Further questioning, patient reports symptoms consistent with GERD, worse at nighttime worse laying down, improved sitting up.  In addition, patient had been taking daily meloxicam in the mornings. -We will give trial of Protonix. -Have advised patient to limit the amount of NSAIDs taken. -Patient continues with symptoms of GERD, consider GI referral as outpatient.  Hypercalcemia: Acute.  Initial calcium noted to be mildly elevated at 10.6.  Patient taking Tums. - Hold Tums  - Given gentle fluids overnight  History of hearing loss: Patient wears hearing aids  Chronic kidney disease stage II:  Renal function remained stable during this hospital course   Discharge Diagnoses:  Principal Problem:   Chest pain Active Problems:   Hard of hearing   Hypercalcemia   Chronic kidney disease, stage II (mild)    Discharge Instructions   Allergies as of 04/06/2018      Reactions   Penicillins Rash   Has patient had a PCN reaction causing immediate rash, facial/tongue/throat swelling, SOB or lightheadedness with hypotension: Yes Has patient had a PCN reaction causing severe rash involving mucus membranes or skin necrosis: Yes Has patient had a PCN reaction that required hospitalization: No Has patient had a PCN reaction occurring within the last 10 years: No If all of the above answers are "NO", then may proceed with Cephalosporin use.      Medication  List    STOP taking these medications   aspirin 325 MG tablet     TAKE these medications   calcium carbonate 500 MG chewable tablet Commonly known as:  TUMS - dosed in mg elemental calcium Chew 1 tablet by mouth daily.    meloxicam 15 MG tablet Commonly known as:  MOBIC Take 15 mg by mouth daily.   pantoprazole 40 MG tablet Commonly known as:  PROTONIX Take 1 tablet (40 mg total) by mouth daily. Start taking on:  04/07/2018   psyllium 58.6 % packet Commonly known as:  METAMUCIL Take 1 packet by mouth daily.   simethicone 80 MG chewable tablet Commonly known as:  MYLICON Chew 80 mg by mouth every 6 (six) hours as needed for flatulence.      Follow-up Information    Charolette Forward, MD. Schedule an appointment as soon as possible for a visit.   Specialty:  Cardiology Why:  as needed Contact information: [address] [place] [address] [phone]        Ria Bush, MD. Schedule an appointment as soon as possible for a visit in 3 week(s).   Specialty:  Family Medicine Contact information: [place] [address] [phone]          Allergies  Allergen Reactions  . Penicillins Rash    Has patient had a PCN reaction causing immediate rash, facial/tongue/throat swelling, SOB or lightheadedness with hypotension: Yes Has patient had a PCN reaction causing severe rash involving mucus membranes or skin necrosis: Yes Has patient had a PCN reaction that required hospitalization: No Has patient had a PCN reaction occurring within the last 10 years: No If all of the above answers are "NO", then may proceed with Cephalosporin use.     Consultations:  Cardiology  Procedures/Studies: Dg Chest 2 View  Result Date: 04/05/2018 CLINICAL DATA:  Chest pain EXAM: CHEST - 2 VIEW COMPARISON:  None. FINDINGS: Lungs are clear.  No pleural effusion or pneumothorax. The heart is normal in size. Visualized osseous structures are within normal limits. IMPRESSION: Normal chest radiographs. Electronically Signed   By: Julian Hy M.D.   On: 04/05/2018 17:12   Nm Myocar Multi W/spect W/wall Motion / Ef  Result Date: 04/06/2018 CLINICAL DATA:   52 year old with chest pain. EXAM: MYOCARDIAL IMAGING WITH SPECT (REST AND EXERCISE) GATED LEFT VENTRICULAR WALL MOTION STUDY LEFT VENTRICULAR EJECTION FRACTION TECHNIQUE: Standard myocardial SPECT imaging was performed after resting intravenous injection of 10 mCi Tc-2m tetrofosmin. Subsequently, exercise tolerance test was performed by the patient under the supervision of the Cardiology staff. At peak-stress, 30 mCi Tc-21m tetrofosmin was injected intravenously and standard myocardial SPECT imaging was performed. Quantitative gated imaging was also performed to evaluate left ventricular wall motion, and estimate left ventricular ejection fraction. COMPARISON:  None. FINDINGS: Perfusion: No decreased activity in the left ventricle on stress imaging to suggest reversible ischemia or infarction. Wall Motion: Normal left ventricular wall motion. No left ventricular dilation. Left Ventricular Ejection Fraction: 54 % End diastolic volume 433 ml End systolic volume 48 ml IMPRESSION: 1. No reversible ischemia or infarction. 2. Normal left ventricular wall motion. 3. Left ventricular ejection fraction is 54%. 4. Non invasive risk stratification*: Low *2012 Appropriate Use Criteria for Coronary Revascularization Focused Update: J Am Coll Cardiol. 2951;88(4):166-063. http://content.airportbarriers.com.aspx?articleid=[email or web address] Electronically Signed   By: Markus Daft M.D.   On: 04/06/2018 16:56     Subjective: Eager to go home  Discharge Exam: Vitals:  04/06/18 1419 04/06/18 1423  BP: (!) 189/80 (!) 142/91  Pulse:    Resp:    Temp:    SpO2:     Vitals:   04/06/18 1412 04/06/18 1417 04/06/18 1419 04/06/18 1423  BP: (!) 174/87 (!) 163/72 (!) 189/80 (!) 142/91  Pulse:      Resp:      Temp:      TempSrc:      SpO2:      Weight:      Height:        General: Pt is alert, awake, not in acute distress Cardiovascular: RRR, S1/S2 +, no rubs, no gallops Respiratory: CTA bilaterally, no wheezing, no  rhonchi Abdominal: Soft, NT, ND, bowel sounds + Extremities: no edema, no cyanosis   The results of significant diagnostics from this hospitalization (including imaging, microbiology, ancillary and laboratory) are listed below for reference.     Microbiology: No results found for this or any previous visit (from the past 240 hour(s)).   Labs: BNP (last 3 results) No results for input(s): BNP in the last 8760 hours. Basic Metabolic Panel: Recent Labs  Lab 04/05/18 1643 04/06/18 0425  NA 137 139  K 3.5 4.0  CL 100* 103  CO2 24 26  GLUCOSE 103* 113*  BUN 7 9  CREATININE 1.28* 1.38*  CALCIUM 10.6* 9.4   Liver Function Tests: Recent Labs  Lab 04/05/18 2239  AST 17  ALT 18  ALKPHOS 40  BILITOT 0.9  PROT 6.8  ALBUMIN 4.2   Recent Labs  Lab 04/05/18 2239  LIPASE 28   No results for input(s): AMMONIA in the last 168 hours. CBC: Recent Labs  Lab 04/05/18 1643 04/06/18 0425  WBC 9.6 7.5  NEUTROABS  --  3.9  HGB 16.6 14.6  HCT 47.6 42.3  MCV 87.8 87.8  PLT 293 246   Cardiac Enzymes: Recent Labs  Lab 04/06/18 0037 04/06/18 0425  TROPONINI <0.03 <0.03   BNP: Invalid input(s): POCBNP CBG: No results for input(s): GLUCAP in the last 168 hours. D-Dimer Recent Labs    04/06/18 0005  DDIMER <0.27   Hgb A1c No results for input(s): HGBA1C in the last 72 hours. Lipid Profile Recent Labs    04/06/18 0425  CHOL 159  HDL 47  LDLCALC 92  TRIG 100  CHOLHDL 3.4   Thyroid function studies No results for input(s): TSH, T4TOTAL, T3FREE, THYROIDAB in the last 72 hours.  Invalid input(s): FREET3 Anemia work up No results for input(s): VITAMINB12, FOLATE, FERRITIN, TIBC, IRON, RETICCTPCT in the last 72 hours. Urinalysis    Component Value Date/Time   BILIRUBINUR negative 09/14/2017 1059   PROTEINUR negative 09/14/2017 1059   UROBILINOGEN 0.2 09/14/2017 1059   NITRITE negative 09/14/2017 1059   LEUKOCYTESUR Negative 09/14/2017 1059   Sepsis  Labs Invalid input(s): PROCALCITONIN,  WBC,  LACTICIDVEN Microbiology No results found for this or any previous visit (from the past 240 hour(s)).  Time spent: 67min  SIGNED:   Marylu Lund, MD  [facility] 04/06/2018, 5:20 PM  If 7PM-7AM, please contact night-coverage [email or web address] Password [email or web address]

## 2018-04-06 NOTE — ED Notes (Signed)
Echo being done at bedside.

## 2018-04-10 ENCOUNTER — Telehealth: Payer: Self-pay

## 2018-04-10 NOTE — Telephone Encounter (Signed)
LM X 2 on patient's cell (okay per DPR) to please contact us as soon as possible to set up hospital follow up appt with Dr. Danise Mina.

## 2018-04-10 NOTE — Telephone Encounter (Signed)
Left message to complete TCM call and to have patient call back to schedule hospital follow up.  Detailed message left, (okay per DPR), patient is to return call.

## 2018-04-11 ENCOUNTER — Encounter (INDEPENDENT_AMBULATORY_CARE_PROVIDER_SITE_OTHER): Payer: Self-pay

## 2018-04-23 ENCOUNTER — Telehealth: Payer: Self-pay | Admitting: Family Medicine

## 2018-04-23 NOTE — Telephone Encounter (Signed)
Ok, I guess we have done everything we can.    Thanks for trying to reach out!    Mandy   Previous Messages    ----- Message -----  From: Darl Householder  Sent: 04/13/2018  9:09 AM  To: Diamond Nickel, RN   Mandy  I have left 2 messages and also sent a my chart message asking pt to call office.   Robin  ----- Message -----  From: Darl Householder  Sent: 04/11/2018  2:43 PM  To: Darl Householder   Left message asking pt to call office. Also sent my chart message asking pt to call office  ----- Message -----  From: Diamond Nickel, RN  Sent: 04/11/2018  2:32 PM  To: Lequita Asal,

## 2018-09-09 ENCOUNTER — Other Ambulatory Visit: Payer: Self-pay | Admitting: Family Medicine

## 2018-09-09 DIAGNOSIS — E785 Hyperlipidemia, unspecified: Secondary | ICD-10-CM

## 2018-09-09 DIAGNOSIS — N182 Chronic kidney disease, stage 2 (mild): Secondary | ICD-10-CM

## 2018-09-09 DIAGNOSIS — R739 Hyperglycemia, unspecified: Secondary | ICD-10-CM

## 2018-09-09 DIAGNOSIS — Z832 Family history of diseases of the blood and blood-forming organs and certain disorders involving the immune mechanism: Secondary | ICD-10-CM

## 2018-09-10 ENCOUNTER — Other Ambulatory Visit (INDEPENDENT_AMBULATORY_CARE_PROVIDER_SITE_OTHER): Payer: No Typology Code available for payment source

## 2018-09-10 DIAGNOSIS — N182 Chronic kidney disease, stage 2 (mild): Secondary | ICD-10-CM | POA: Diagnosis not present

## 2018-09-10 DIAGNOSIS — R739 Hyperglycemia, unspecified: Secondary | ICD-10-CM

## 2018-09-10 LAB — RENAL FUNCTION PANEL
ALBUMIN: 4.6 g/dL (ref 3.5–5.2)
BUN: 13 mg/dL (ref 6–23)
CALCIUM: 9.4 mg/dL (ref 8.4–10.5)
CHLORIDE: 102 meq/L (ref 96–112)
CO2: 26 mEq/L (ref 19–32)
Creatinine, Ser: 1.33 mg/dL (ref 0.40–1.50)
GFR: 59.74 mL/min — ABNORMAL LOW (ref >60.00)
Glucose, Bld: 128 mg/dL — ABNORMAL HIGH (ref 70–99)
Phosphorus: 3.1 mg/dL (ref 2.3–4.6)
Potassium: 4.2 mEq/L (ref 3.5–5.1)
SODIUM: 135 meq/L (ref 135–145)

## 2018-09-10 LAB — MICROALBUMIN / CREATININE URINE RATIO
Creatinine,U: 60.7 mg/dL
MICROALB/CREAT RATIO: 1.2 mg/g (ref 0.0–30.0)

## 2018-09-10 LAB — HEMOGLOBIN A1C: HEMOGLOBIN A1C: 5.6 % (ref 4.6–6.5)

## 2018-09-16 ENCOUNTER — Encounter: Payer: Self-pay | Admitting: Family Medicine

## 2018-09-16 NOTE — Progress Notes (Signed)
BP 140/88 (BP Location: Left Arm, Patient Position: Sitting, Cuff Size: Normal)   Pulse (!) 102   Temp 97.8 F (36.6 C) (Oral)   Ht 6' 1.25" (1.861 m)   Wt 206 lb 12 oz (93.8 kg)   SpO2 97%   BMI 27.09 kg/m   On repeat, bp 150/98  CC: CPE Subjective:    Patient ID: Duane Hudson, male    DOB: 06-02-1966, 52 y.o.   MRN: [id number]  HPI: [name] is a 52 y.o. male presenting on 09/17/2018 for Annual Exam   R heel sole pain for 2 months worse with getting up in the mornings. Using Dr Darrick Grinder insoles.   ER visit 03/2018 for chest pain, saw Dr Terrence Dupont in f/u.   Preventative: COLONOSCOPY08/2017 - 2 HPs, concern for SSP rpt 5 yrs (Pyrtle) Prostate cancer - yearly screening (forgot PSA). Nocturia x3.  Flu shot declines Tetanus shot - unsure. Declines.  Seat belt use discussed Sunscreen use discussed. No changing moles on skin.  Ex smoker, quit 2007, 15 PY hx.  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 Interim medical history since our last visit reviewed. Allergies and medications reviewed and updated. Outpatient Medications Prior to Visit  Medication Sig Dispense Refill  . psyllium (METAMUCIL) 58.6 % packet Take 1 packet by mouth daily.    . calcium carbonate (TUMS - DOSED IN MG ELEMENTAL CALCIUM) 500 MG chewable tablet Chew 1 tablet by mouth daily.    . meloxicam (MOBIC) 15 MG tablet Take 15 mg by mouth daily.     . pantoprazole (PROTONIX) 40 MG tablet Take 1 tablet (40 mg total) by mouth daily. 30 tablet 0  . simethicone (MYLICON) 80 MG chewable tablet Chew 80 mg by mouth every 6 (six) hours as needed for flatulence.     No  facility-administered medications prior to visit.      Per HPI unless specifically indicated in ROS section below Review of Systems  Constitutional: Negative for activity change, appetite change, chills, fatigue, fever and unexpected weight change.  HENT: Negative for hearing loss.   Eyes: Negative for visual disturbance.  Respiratory: Negative for cough, chest tightness, shortness of breath and wheezing.   Cardiovascular: Negative for chest pain, palpitations and leg swelling.  Gastrointestinal: Negative for abdominal distention, abdominal pain, blood in stool, constipation, diarrhea, nausea and vomiting.  Genitourinary: Negative for difficulty urinating and hematuria.  Musculoskeletal: Negative for arthralgias, myalgias and neck pain.  Skin: Negative for rash.  Neurological: Negative for dizziness, seizures, syncope and headaches.  Hematological: Negative for adenopathy. Does not bruise/bleed easily.  Psychiatric/Behavioral: Negative for dysphoric mood. The patient is not nervous/anxious.        Objective:    BP 140/88 (BP Location: Left Arm, Patient Position: Sitting, Cuff Size: Normal)   Pulse (!) 102   Temp 97.8 F (36.6 C) (Oral)   Ht 6' 1.25" (1.861 m)   Wt 206 lb 12 oz (93.8 kg)   SpO2 97%   BMI 27.09 kg/m   Wt Readings from Last 3 Encounters:  09/17/18 206 lb 12 oz (93.8 kg)  04/06/18 204 lb 12.8 oz (92.9 kg)  09/14/17 217 lb (98.4 kg)  Physical Exam  Constitutional: He is oriented to person, place, and time. He appears well-developed and well-nourished. No distress.  HENT:  Head: Normocephalic and atraumatic.  Right Ear: Hearing, tympanic membrane, external ear and ear canal normal.  Left Ear: Hearing, tympanic membrane, external ear and ear canal normal.  Nose: Nose normal.  Mouth/Throat: Uvula is midline, oropharynx is clear and moist and mucous membranes are normal. No oropharyngeal exudate, posterior oropharyngeal edema or posterior oropharyngeal erythema.    Eyes: Pupils are equal, round, and reactive to light. Conjunctivae and EOM are normal. No scleral icterus.  Neck: Normal range of motion. Neck supple.  Cardiovascular: Normal rate, regular rhythm, normal heart sounds and intact distal pulses.  No murmur heard. Pulses:      Radial pulses are 2+ on the right side, and 2+ on the left side.  Pulmonary/Chest: Effort normal and breath sounds normal. No respiratory distress. He has no wheezes. He has no rales.  Abdominal: Soft. Bowel sounds are normal. He exhibits no distension and no mass. There is no tenderness. There is no rebound and no guarding.  Genitourinary: Rectal exam shows external hemorrhoid. Rectal exam shows no internal hemorrhoid, no fissure, no mass, no tenderness and anal tone normal. Prostate is enlarged (25gm, mildly). Prostate is not tender.  Musculoskeletal: Normal range of motion. He exhibits no edema.  Lymphadenopathy:    He has no cervical adenopathy.  Neurological: He is alert and oriented to person, place, and time.  CN grossly intact, station and gait intact  Skin: Skin is warm and dry. No rash noted.  Psychiatric: He has a normal mood and affect. His behavior is normal. Judgment and thought content normal.  Nursing note and vitals reviewed.  Results for orders placed or performed in visit on 09/10/18  Hemoglobin A1c  Result Value Ref Range   Hgb A1c MFr Bld 5.6 4.6 - 6.5 %  Microalbumin / creatinine urine ratio  Result Value Ref Range   Microalb, Ur <0.7 0.0 - 1.9 mg/dL   Creatinine,U 60.7 mg/dL   Microalb Creat Ratio 1.2 0.0 - 30.0 mg/g  Renal function panel  Result Value Ref Range   Sodium 135 135 - 145 mEq/L   Potassium 4.2 3.5 - 5.1 mEq/L   Chloride 102 96 - 112 mEq/L   CO2 26 19 - 32 mEq/L   Calcium 9.4 8.4 - 10.5 mg/dL   Albumin 4.6 3.5 - 5.2 g/dL   BUN 13 6 - 23 mg/dL   Creatinine, Ser 1.33 0.40 - 1.50 mg/dL   Glucose, Bld 128 (H) 70 - 99 mg/dL   Phosphorus 3.1 2.3 - 4.6 mg/dL   GFR 59.74 (L)  >60.00 mL/min   Lab Results  Component Value Date   PSA 1.07 09/06/2017   PSA 1.23 07/08/2016    Lab Results  Component Value Date   CHOL 159 04/06/2018   HDL 47 04/06/2018   LDLCALC 92 04/06/2018   TRIG 100 04/06/2018   CHOLHDL 3.4 04/06/2018       Assessment & Plan:   Problem List Items Addressed This Visit    Hyperglycemia    A1c stable      Health maintenance examination - Primary    Preventative protocols reviewed and updated unless pt declined. Discussed healthy diet and lifestyle.       Hard of hearing   Elevated blood pressure reading in office without diagnosis of hypertension    Elevated readings noted today - discussed healthy diet and lifestyle changes to control  BP. rec low salt/sodium diet, DASH diet handout provided today. I did ask him to focus on exercise routine, limited salt in diet, return in 3 months for BP f/u visit.      Dyslipidemia    Chronic, stable off meds. FLP checked 03/2018.  The 10-year ASCVD risk score Mikey Bussing DC Brooke Bonito., et al., 2013) is: 4.3%   Values used to calculate the score:     Age: 83 years     Sex: Male     Is Non-Hispanic African American: No     Diabetic: No     Tobacco smoker: No     Systolic Blood Pressure: 537 mmHg     Is BP treated: No     HDL Cholesterol: 47 mg/dL     Total Cholesterol: 159 mg/dL       Chronic kidney disease, stage II (mild)    Cr stable.           No orders of the defined types were placed in this encounter.  No orders of the defined types were placed in this encounter.   Follow up plan: Return in about 3 months (around 12/18/2018) for follow up visit.  Ria Bush, MD

## 2018-09-16 NOTE — Assessment & Plan Note (Signed)
Preventative protocols reviewed and updated unless pt declined. Discussed healthy diet and lifestyle.  

## 2018-09-17 ENCOUNTER — Encounter: Payer: Self-pay | Admitting: Family Medicine

## 2018-09-17 ENCOUNTER — Ambulatory Visit (INDEPENDENT_AMBULATORY_CARE_PROVIDER_SITE_OTHER): Payer: No Typology Code available for payment source | Admitting: Family Medicine

## 2018-09-17 VITALS — BP 140/88 | HR 102 | Temp 97.8°F | Ht 73.25 in | Wt 206.8 lb

## 2018-09-17 DIAGNOSIS — R739 Hyperglycemia, unspecified: Secondary | ICD-10-CM

## 2018-09-17 DIAGNOSIS — Z0000 Encounter for general adult medical examination without abnormal findings: Secondary | ICD-10-CM

## 2018-09-17 DIAGNOSIS — H9193 Unspecified hearing loss, bilateral: Secondary | ICD-10-CM

## 2018-09-17 DIAGNOSIS — R030 Elevated blood-pressure reading, without diagnosis of hypertension: Secondary | ICD-10-CM

## 2018-09-17 DIAGNOSIS — E785 Hyperlipidemia, unspecified: Secondary | ICD-10-CM

## 2018-09-17 DIAGNOSIS — N182 Chronic kidney disease, stage 2 (mild): Secondary | ICD-10-CM

## 2018-09-17 NOTE — Assessment & Plan Note (Signed)
Chronic, stable off meds. FLP checked 03/2018.  The 10-year ASCVD risk score Mikey Bussing DC Brooke Bonito., et al., 2013) is: 4.3%   Values used to calculate the score:     Age: 53 years     Sex: Male     Is Non-Hispanic African American: No     Diabetic: No     Tobacco smoker: No     Systolic Blood Pressure: 867 mmHg     Is BP treated: No     HDL Cholesterol: 47 mg/dL     Total Cholesterol: 159 mg/dL

## 2018-09-17 NOTE — Assessment & Plan Note (Signed)
Cr stable 

## 2018-09-17 NOTE — Assessment & Plan Note (Signed)
A1c stable

## 2018-09-17 NOTE — Patient Instructions (Addendum)
Blood pressure was too high today - work on exercise routine over the next 3 months, avoid salt/sodium in the diet, and return in 3 months for follow up blood pressure check.  Look into DASH diet:   DASH Eating Plan DASH stands for "Dietary Approaches to Stop Hypertension." The DASH eating plan is a healthy eating plan that has been shown to reduce high blood pressure (hypertension). It may also reduce your risk for type 2 diabetes, heart disease, and stroke. The DASH eating plan may also help with weight loss. What are tips for following this plan? General guidelines  Avoid eating more than 2,300 mg (milligrams) of salt (sodium) a day. If you have hypertension, you may need to reduce your sodium intake to 1,500 mg a day.  Limit alcohol intake to no more than 1 drink a day for nonpregnant women and 2 drinks a day for men. One drink equals 12 oz of beer, 5 oz of wine, or 1 oz of hard liquor.  Work with your health care provider to maintain a healthy body weight or to lose weight. Ask what an ideal weight is for you.  Get at least 30 minutes of exercise that causes your heart to beat faster (aerobic exercise) most days of the week. Activities may include walking, swimming, or biking.  Work with your health care provider or diet and nutrition specialist (dietitian) to adjust your eating plan to your individual calorie needs. Reading food labels  Check food labels for the amount of sodium per serving. Choose foods with less than 5 percent of the Daily Value of sodium. Generally, foods with less than 300 mg of sodium per serving fit into this eating plan.  To find whole grains, look for the word "whole" as the first word in the ingredient list. Shopping  Buy products labeled as "low-sodium" or "no salt added."  Buy fresh foods. Avoid canned foods and premade or frozen meals. Cooking  Avoid adding salt when cooking. Use salt-free seasonings or herbs instead of table salt or sea salt. Check  with your health care provider or pharmacist before using salt substitutes.  Do not fry foods. Cook foods using healthy methods such as baking, boiling, grilling, and broiling instead.  Cook with heart-healthy oils, such as olive, canola, soybean, or sunflower oil. Meal planning   Eat a balanced diet that includes: ? 5 or more servings of fruits and vegetables each day. At each meal, try to fill half of your plate with fruits and vegetables. ? Up to 6-8 servings of whole grains each day. ? Less than 6 oz of lean meat, poultry, or fish each day. A 3-oz serving of meat is about the same size as a deck of cards. One egg equals 1 oz. ? 2 servings of low-fat dairy each day. ? A serving of nuts, seeds, or beans 5 times each week. ? Heart-healthy fats. Healthy fats called Omega-3 fatty acids are found in foods such as flaxseeds and coldwater fish, like sardines, salmon, and mackerel.  Limit how much you eat of the following: ? Canned or prepackaged foods. ? Food that is high in trans fat, such as fried foods. ? Food that is high in saturated fat, such as fatty meat. ? Sweets, desserts, sugary drinks, and other foods with added sugar. ? Full-fat dairy products.  Do not salt foods before eating.  Try to eat at least 2 vegetarian meals each week.  Eat more home-cooked food and less restaurant, buffet, and fast food.  When eating at a restaurant, ask that your food be prepared with less salt or no salt, if possible. What foods are recommended? The items listed may not be a complete list. Talk with your dietitian about what dietary choices are best for you. Grains Whole-grain or whole-wheat bread. Whole-grain or whole-wheat pasta. Brown rice. [place] Morrow. Bulgur. Whole-grain and low-sodium cereals. Pita bread. Low-fat, low-sodium crackers. Whole-wheat flour tortillas. Vegetables Fresh or frozen vegetables (raw, steamed, roasted, or grilled). Low-sodium or reduced-sodium tomato and  vegetable juice. Low-sodium or reduced-sodium tomato sauce and tomato paste. Low-sodium or reduced-sodium canned vegetables. Fruits All fresh, dried, or frozen fruit. Canned fruit in natural juice (without added sugar). Meat and other protein foods Skinless chicken or [place]. Ground chicken or [place]. Pork with fat trimmed off. Fish and seafood. Egg whites. Dried beans, peas, or lentils. Unsalted nuts, nut butters, and seeds. Unsalted canned beans. Lean cuts of beef with fat trimmed off. Low-sodium, lean deli meat. Dairy Low-fat (1%) or fat-free (skim) milk. Fat-free, low-fat, or reduced-fat cheeses. Nonfat, low-sodium ricotta or cottage cheese. Low-fat or nonfat yogurt. Low-fat, low-sodium cheese. Fats and oils Soft margarine without trans fats. Vegetable oil. Low-fat, reduced-fat, or light mayonnaise and salad dressings (reduced-sodium). Canola, safflower, olive, soybean, and sunflower oils. Avocado. Seasoning and other foods Herbs. Spices. Seasoning mixes without salt. Unsalted popcorn and pretzels. Fat-free sweets. What foods are not recommended? The items listed may not be a complete list. Talk with your dietitian about what dietary choices are best for you. Grains Baked goods made with fat, such as croissants, muffins, or some breads. Dry pasta or rice meal packs. Vegetables Creamed or fried vegetables. Vegetables in a cheese sauce. Regular canned vegetables (not low-sodium or reduced-sodium). Regular canned tomato sauce and paste (not low-sodium or reduced-sodium). Regular tomato and vegetable juice (not low-sodium or reduced-sodium). Angie Fava. Olives. Fruits Canned fruit in a light or heavy syrup. Fried fruit. Fruit in cream or butter sauce. Meat and other protein foods Fatty cuts of meat. Ribs. Fried meat. Berniece Salines. Sausage. Bologna and other processed lunch meats. Salami. Fatback. Hotdogs. Bratwurst. Salted nuts and seeds. Canned beans with added salt. Canned or smoked fish. Whole eggs or  egg yolks. Chicken or [place] with skin. Dairy Whole or 2% milk, cream, and half-and-half. Whole or full-fat cream cheese. Whole-fat or sweetened yogurt. Full-fat cheese. Nondairy creamers. Whipped toppings. Processed cheese and cheese spreads. Fats and oils Butter. Stick margarine. Lard. Shortening. Ghee. Bacon fat. Tropical oils, such as coconut, palm kernel, or palm oil. Seasoning and other foods Salted popcorn and pretzels. Onion salt, garlic salt, seasoned salt, table salt, and sea salt. Worcestershire sauce. Tartar sauce. Barbecue sauce. Teriyaki sauce. Soy sauce, including reduced-sodium. Steak sauce. Canned and packaged gravies. Fish sauce. Oyster sauce. Cocktail sauce. Horseradish that you find on the shelf. Ketchup. Mustard. Meat flavorings and tenderizers. Bouillon cubes. Hot sauce and Tabasco sauce. Premade or packaged marinades. Premade or packaged taco seasonings. Relishes. Regular salad dressings. Where to find more information:  National Heart, Lung, and [place]: https://wilson-eaton.com/  American Heart Association: www.heart.org Summary  The DASH eating plan is a healthy eating plan that has been shown to reduce high blood pressure (hypertension). It may also reduce your risk for type 2 diabetes, heart disease, and stroke.  With the DASH eating plan, you should limit salt (sodium) intake to 2,300 mg a day. If you have hypertension, you may need to reduce your sodium intake to 1,500 mg a day.  When on the DASH eating plan,  aim to eat more fresh fruits and vegetables, whole grains, lean proteins, low-fat dairy, and heart-healthy fats.  Work with your health care provider or diet and nutrition specialist (dietitian) to adjust your eating plan to your individual calorie needs. This information is not intended to replace advice given to you by your health care provider. Make sure you discuss any questions you have with your health care provider. Document Released: 10/06/2011  Document Revised: 10/10/2016 Document Reviewed: 10/10/2016 Elsevier Interactive Patient Education  2018 [place] Maintenance, Male A healthy lifestyle and preventive care is important for your health and wellness. Ask your health care provider about what schedule of regular examinations is right for you. What should I know about weight and diet? Eat a Healthy Diet  Eat plenty of vegetables, fruits, whole grains, low-fat dairy products, and lean protein.  Do not eat a lot of foods high in solid fats, added sugars, or salt.  Maintain a Healthy Weight Regular exercise can help you achieve or maintain a healthy weight. You should:  Do at least 150 minutes of exercise each week. The exercise should increase your heart rate and make you sweat (moderate-intensity exercise).  Do strength-training exercises at least twice a week.  Watch Your Levels of Cholesterol and Blood Lipids  Have your blood tested for lipids and cholesterol every 5 years starting at 53 years of age. If you are at high risk for heart disease, you should start having your blood tested when you are 53 years old. You may need to have your cholesterol levels checked more often if: ? Your lipid or cholesterol levels are high. ? You are older than 53 years of age. ? You are at high risk for heart disease.  What should I know about cancer screening? Many types of cancers can be detected early and may often be prevented. Lung Cancer  You should be screened every year for lung cancer if: ? You are a current smoker who has smoked for at least 30 years. ? You are a former smoker who has quit within the past 15 years.  Talk to your health care provider about your screening options, when you should start screening, and how often you should be screened.  Colorectal Cancer  Routine colorectal cancer screening usually begins at 53 years of age and should be repeated every 5-10 years until you are 53 years old. You  may need to be screened more often if early forms of precancerous polyps or small growths are found. Your health care provider may recommend screening at an earlier age if you have risk factors for colon cancer.  Your health care provider may recommend using home test kits to check for hidden blood in the stool.  A small camera at the end of a tube can be used to examine your colon (sigmoidoscopy or colonoscopy). This checks for the earliest forms of colorectal cancer.  Prostate and Testicular Cancer  Depending on your age and overall health, your health care provider may do certain tests to screen for prostate and testicular cancer.  Talk to your health care provider about any symptoms or concerns you have about testicular or prostate cancer.  Skin Cancer  Check your skin from head to toe regularly.  Tell your health care provider about any new moles or changes in moles, especially if: ? There is a change in a mole's size, shape, or color. ? You have a mole that is larger than a pencil eraser.  Always use sunscreen. Apply sunscreen liberally and repeat throughout the day.  Protect yourself by wearing long sleeves, pants, a wide-brimmed hat, and sunglasses when outside.  What should I know about heart disease, diabetes, and high blood pressure?  If you are 18-63 years of age, have your blood pressure checked every 3-5 years. If you are 71 years of age or older, have your blood pressure checked every year. You should have your blood pressure measured twice-once when you are at a hospital or clinic, and once when you are not at a hospital or clinic. Record the average of the two measurements. To check your blood pressure when you are not at a hospital or clinic, you can use: ? An automated blood pressure machine at a pharmacy. ? A home blood pressure monitor.  Talk to your health care provider about your target blood pressure.  If you are between 67-92 years old, ask your health care  provider if you should take aspirin to prevent heart disease.  Have regular diabetes screenings by checking your fasting blood sugar level. ? If you are at a normal weight and have a low risk for diabetes, have this test once every three years after the age of 75. ? If you are overweight and have a high risk for diabetes, consider being tested at a younger age or more often.  A one-time screening for abdominal aortic aneurysm (AAA) by ultrasound is recommended for men aged 83-75 years who are current or former smokers. What should I know about preventing infection? Hepatitis B If you have a higher risk for hepatitis B, you should be screened for this virus. Talk with your health care provider to find out if you are at risk for hepatitis B infection. Hepatitis C Blood testing is recommended for:  Everyone born from [birth year] through 1966.  Anyone with known risk factors for hepatitis C.  Sexually Transmitted Diseases (STDs)  You should be screened each year for STDs including gonorrhea and chlamydia if: ? You are sexually active and are younger than 53 years of age. ? You are older than 53 years of age and your health care provider tells you that you are at risk for this type of infection. ? Your sexual activity has changed since you were last screened and you are at an increased risk for chlamydia or gonorrhea. Ask your health care provider if you are at risk.  Talk with your health care provider about whether you are at high risk of being infected with HIV. Your health care provider may recommend a prescription medicine to help prevent HIV infection.  What else can I do?  Schedule regular health, dental, and eye exams.  Stay current with your vaccines (immunizations).  Do not use any tobacco products, such as cigarettes, chewing tobacco, and e-cigarettes. If you need help quitting, ask your health care provider.  Limit alcohol intake to no more than 2 drinks per day. One drink equals 12  ounces of beer, 5 ounces of wine, or 1 ounces of hard liquor.  Do not use street drugs.  Do not share needles.  Ask your health care provider for help if you need support or information about quitting drugs.  Tell your health care provider if you often feel depressed.  Tell your health care provider if you have ever been abused or do not feel safe at home. This information is not intended to replace advice given to you by your health care provider. Make sure you discuss any  questions you have with your health care provider. Document Released: 04/14/2008 Document Revised: 06/15/2016 Document Reviewed: 07/21/2015 Elsevier Interactive Patient Education  [employer].

## 2018-09-17 NOTE — Assessment & Plan Note (Signed)
Elevated readings noted today - discussed healthy diet and lifestyle changes to control BP. rec low salt/sodium diet, DASH diet handout provided today. I did ask him to focus on exercise routine, limited salt in diet, return in 3 months for BP f/u visit.

## 2018-11-20 ENCOUNTER — Telehealth: Payer: Self-pay | Admitting: Family Medicine

## 2018-11-20 NOTE — Telephone Encounter (Signed)
Left message asking pt to call office please r/s 2/20 with dr g

## 2018-12-20 ENCOUNTER — Ambulatory Visit: Payer: No Typology Code available for payment source | Admitting: Family Medicine

## 2019-08-30 ENCOUNTER — Other Ambulatory Visit: Payer: Self-pay

## 2019-08-30 DIAGNOSIS — Z20822 Contact with and (suspected) exposure to covid-19: Secondary | ICD-10-CM

## 2019-08-31 LAB — NOVEL CORONAVIRUS, NAA: SARS-CoV-2, NAA: NOT DETECTED

## 2020-03-16 ENCOUNTER — Ambulatory Visit (INDEPENDENT_AMBULATORY_CARE_PROVIDER_SITE_OTHER)
Admission: RE | Admit: 2020-03-16 | Discharge: 2020-03-16 | Disposition: A | Discharge status: Home / Self Care | Payer: Commercial Managed Care - PPO | Source: Ambulatory Visit | Attending: Family Medicine | Admitting: Family Medicine

## 2020-03-16 ENCOUNTER — Other Ambulatory Visit: Payer: Self-pay

## 2020-03-16 ENCOUNTER — Ambulatory Visit (INDEPENDENT_AMBULATORY_CARE_PROVIDER_SITE_OTHER): Payer: Commercial Managed Care - PPO | Admitting: Family Medicine

## 2020-03-16 ENCOUNTER — Encounter: Payer: Self-pay | Admitting: Family Medicine

## 2020-03-16 VITALS — BP 158/94 | HR 100 | Temp 96.7°F | Ht 73.25 in | Wt 212.1 lb

## 2020-03-16 DIAGNOSIS — R1013 Epigastric pain: Secondary | ICD-10-CM | POA: Diagnosis not present

## 2020-03-16 DIAGNOSIS — R1011 Right upper quadrant pain: Secondary | ICD-10-CM | POA: Diagnosis not present

## 2020-03-16 LAB — COMPREHENSIVE METABOLIC PANEL
ALT: 15 U/L (ref 0–53)
AST: 13 U/L (ref 0–37)
Albumin: 4.6 g/dL (ref 3.5–5.2)
Alkaline Phosphatase: 46 U/L (ref 39–117)
BUN: 9 mg/dL (ref 6–23)
CO2: 27 mEq/L (ref 19–32)
Calcium: 9 mg/dL (ref 8.4–10.5)
Chloride: 99 mEq/L (ref 96–112)
Creatinine, Ser: 1.06 mg/dL (ref 0.40–1.50)
GFR: 72.61 mL/min (ref >60.00)
Glucose, Bld: 122 mg/dL — ABNORMAL HIGH (ref 70–99)
Potassium: 4.3 mEq/L (ref 3.5–5.1)
Sodium: 133 mEq/L — ABNORMAL LOW (ref 135–145)
Total Bilirubin: 0.8 mg/dL (ref 0.2–1.2)
Total Protein: 6.8 g/dL (ref 6.0–8.3)

## 2020-03-16 LAB — CBC WITH DIFFERENTIAL/PLATELET
Basophils Absolute: 0 10*3/uL (ref 0.0–0.1)
Basophils Relative: 0.4 % (ref 0.0–3.0)
Eosinophils Absolute: 0.1 10*3/uL (ref 0.0–0.7)
Eosinophils Relative: 1.1 % (ref 0.0–5.0)
HCT: 41.2 % (ref 39.0–52.0)
Hemoglobin: 14.7 g/dL (ref 13.0–17.0)
Lymphocytes Relative: 20.4 % (ref 12.0–46.0)
Lymphs Abs: 1.3 10*3/uL (ref 0.7–4.0)
MCHC: 35.8 g/dL (ref 30.0–36.0)
MCV: 88.5 fl (ref 78.0–100.0)
Monocytes Absolute: 0.4 10*3/uL (ref 0.1–1.0)
Monocytes Relative: 5.8 % (ref 3.0–12.0)
Neutro Abs: 4.4 10*3/uL (ref 1.4–7.7)
Neutrophils Relative %: 72.3 % (ref 43.0–77.0)
Platelets: 276 10*3/uL (ref 150.0–400.0)
RBC: 4.66 Mil/uL (ref 4.22–5.81)
RDW: 13.1 % (ref 11.5–15.5)
WBC: 6.1 10*3/uL (ref 4.0–10.5)

## 2020-03-16 NOTE — Progress Notes (Signed)
Subjective:    Patient ID: Duane Hudson, male    DOB: January 30, 1966, 54 y.o.   MRN: [id number]  HPI Chief Complaint  Patient presents with  . Abdominal Pain    x 1 week. Burning sensation around naval, radiating across torso, up to right chest and under right arm. No change in BMs. Denies N/V. Pt states that he stopped drinking coffee x 1 week and his abd pain seems to have lessened - he drank coffee yesterday and today and hte pain seems to worsen.    This is a 54 yo male who presents today with above cc. Patient works on [employer]. Was going to a job and had sudden onset of sharp burning pain around umbilicus. Felt like he had to go to the bathroom. Some right upper quadrant gas pain, burning that is different. Some improvement with Tylenol. No history of kidney stones. Good water intake. No ETOH. No urinary symptoms. BMs normal, have been qod with stopping caffeine. Epigastric pain has been constant, ruq pain comes and goes. RUQ was worse after eating a hamburger and fries yesterday. Able to sleep without difficulty. Awoke this am, 3 am, had pain. Pain did not awaken him from sleep.    Review of Systems Per HPI    Objective:   Physical Exam Vitals reviewed.  Constitutional:      General: He is not in acute distress.    Appearance: He is well-developed and normal weight. He is not ill-appearing, toxic-appearing or diaphoretic.  HENT:     Head: Normocephalic and atraumatic.  Cardiovascular:     Rate and Rhythm: Normal rate and regular rhythm.     Heart sounds: Normal heart sounds.  Pulmonary:     Effort: Pulmonary effort is normal.     Breath sounds: Normal breath sounds.  Abdominal:     General: Abdomen is flat. Bowel sounds are normal. There is no distension or abdominal bruit. There are no signs of injury.     Palpations: Abdomen is soft.     Tenderness: There is no abdominal tenderness (reported ruq discomfort after exam). There is no right CVA tenderness, left CVA  tenderness, guarding or rebound.  Skin:    General: Skin is warm and dry.  Neurological:     Mental Status: He is alert and oriented to person, place, and time.  Psychiatric:        Mood and Affect: Mood normal.        Behavior: Behavior normal.       BP (!) 158/94 (BP Location: Left Arm, Patient Position: Sitting, Cuff Size: Normal)   Pulse 100   Temp (!) 96.7 F (35.9 C) (Temporal)   Ht 6' 1.25" (1.861 m)   Wt 212 lb 1.9 oz (96.2 kg)   SpO2 98%   BMI 27.80 kg/m  Wt Readings from Last 3 Encounters:  03/16/20 212 lb 1.9 oz (96.2 kg)  09/17/18 206 lb 12 oz (93.8 kg)  04/06/18 204 lb 12.8 oz (92.9 kg)   BP Readings from Last 3 Encounters:  03/16/20 (!) 158/94  09/17/18 140/88  04/06/18 (!) 142/91       Assessment & Plan:  1. RUQ pain - seems to be different than epigastric pain, will check labs, xray, [place] - DG Abd 1 View; Future - US Abdomen Limited RUQ; Future  2. Epigastric pain Patient Instructions  Good to see you today  Please eat bland foods  I will notify you of lab results and abdominal  xray results  You will get a call about scheduling your ultrasound  Ok to take tylenol 2 tablets every 8 hours  - DG Abd 1 View; Future - CBC with Differential - Comprehensive metabolic panel - follow up precautions reviewed  This visit occurred during the SARS-CoV-2 public health emergency.  Safety protocols were in place, including screening questions prior to the visit, additional usage of staff PPE, and extensive cleaning of exam room while observing appropriate contact time as indicated for disinfecting solutions.     Clarene Reamer, FNP-BC  [facility]  03/16/2020 9:43 AM

## 2020-03-16 NOTE — Patient Instructions (Signed)
Good to see you today  Please eat bland foods  I will notify you of lab results and abdominal xray results  You will get a call about scheduling your ultrasound  Ok to take tylenol 2 tablets every 8 hours

## 2020-03-24 ENCOUNTER — Ambulatory Visit
Admission: RE | Admit: 2020-03-24 | Discharge: 2020-03-24 | Disposition: A | Discharge status: Home / Self Care | Payer: Commercial Managed Care - PPO | Source: Ambulatory Visit | Attending: Family Medicine | Admitting: Family Medicine

## 2020-03-24 DIAGNOSIS — R1011 Right upper quadrant pain: Secondary | ICD-10-CM

## 2021-07-05 ENCOUNTER — Encounter: Payer: Self-pay | Admitting: Internal Medicine

## 2021-09-07 IMAGING — DX DG ABDOMEN 1V
2 series · 2 of 2 positions shown · non-contrast
Comparison: None.

CLINICAL DATA: Abdominal pain for 7 days.

EXAM:
ABDOMEN - 1 VIEW

[abdomen kub (1 of 2)]
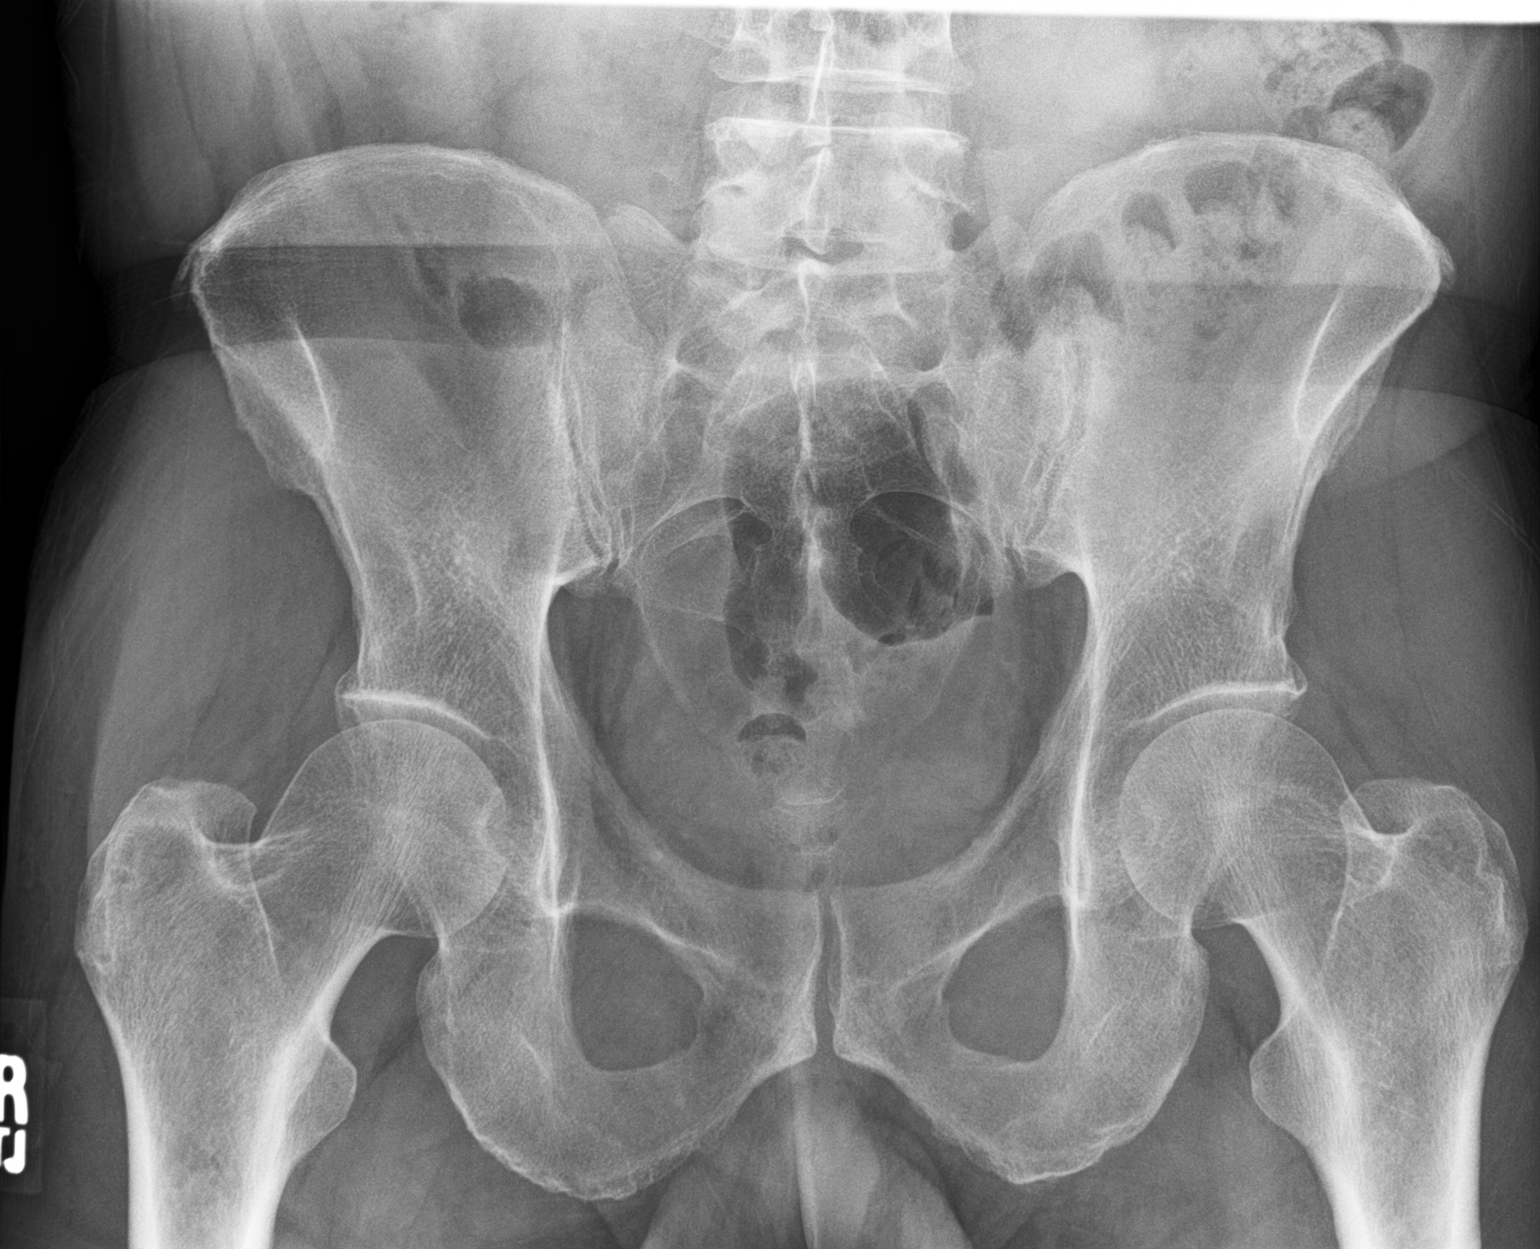

[abdomen kub (2 of 2)]
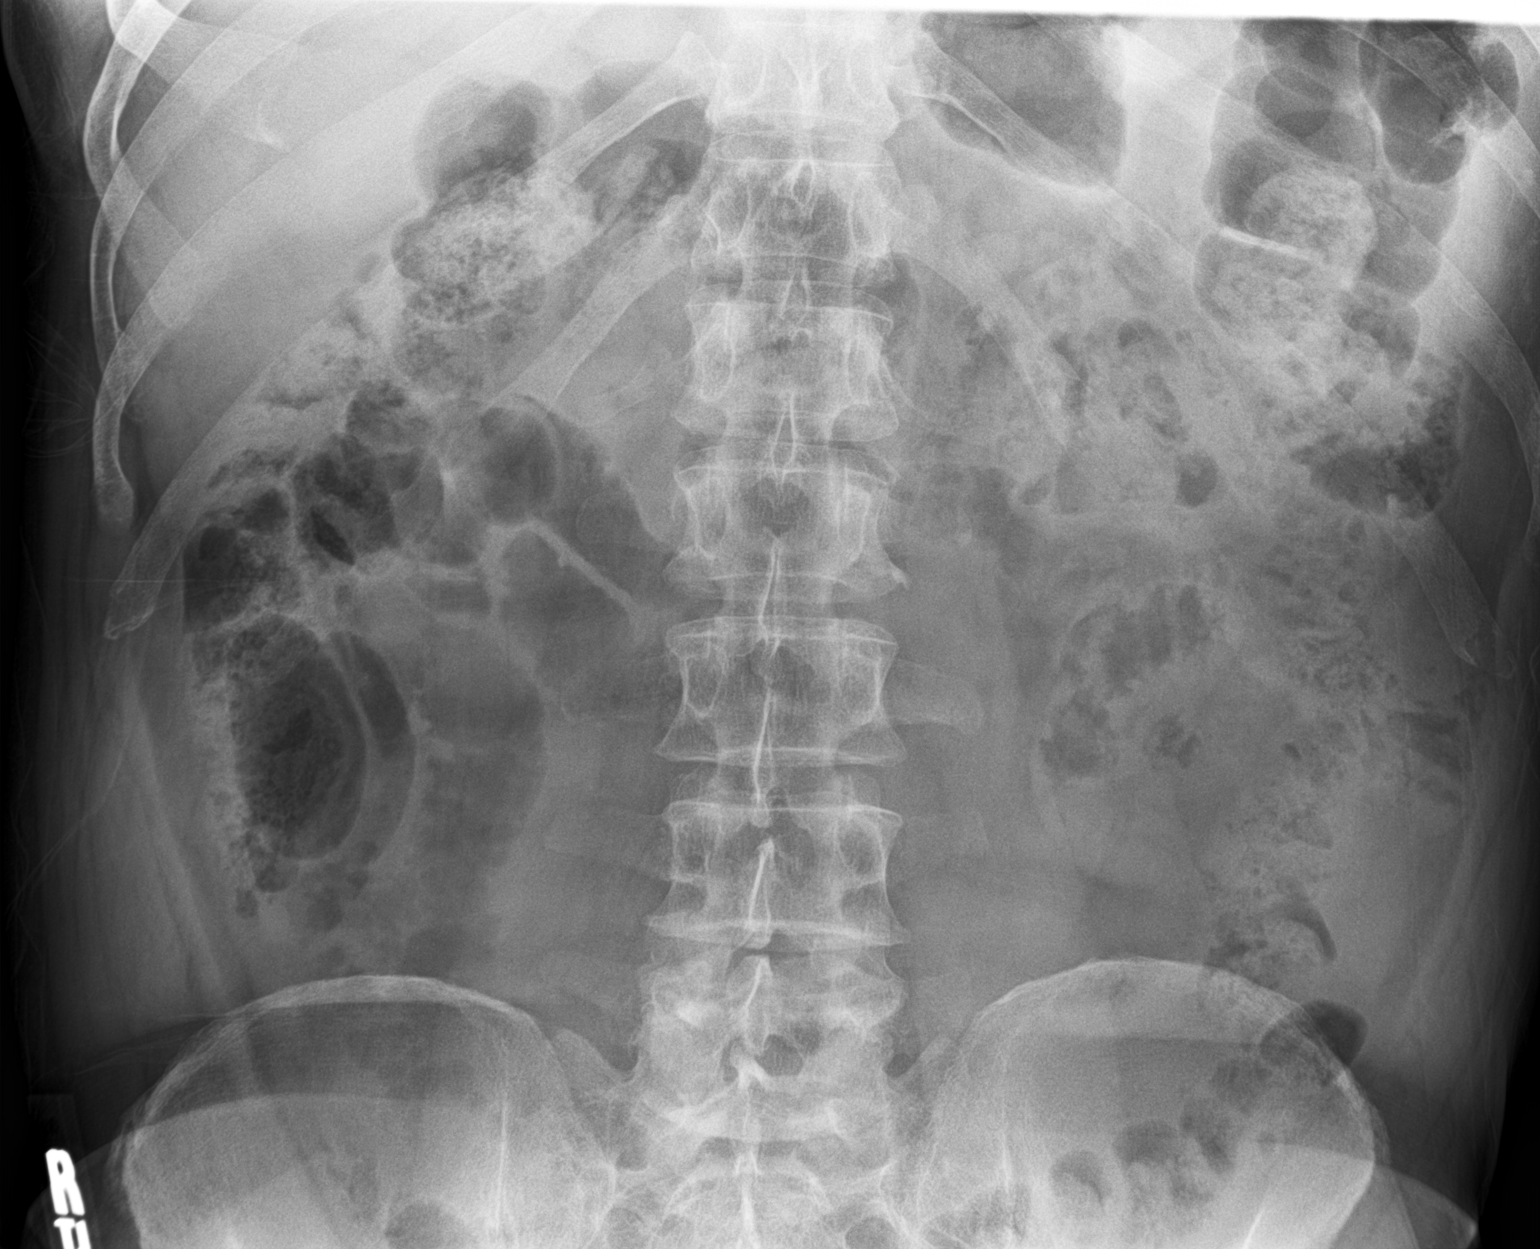

[2 of 2 positions shown; findings below may reference images not displayed]

FINDINGS: The bowel gas pattern is normal. No radio-opaque calculi or other
significant radiographic abnormality are seen.
IMPRESSION: Negative.

## 2023-03-28 ENCOUNTER — Telehealth: Payer: Self-pay | Admitting: Family Medicine

## 2023-03-28 NOTE — Telephone Encounter (Signed)
Patient's wife contacted the office requesting an appointment for patient. Notified patient's wife that since it has been over 3 yrs since Dr. Reece Agar has seen pt we would have to make an appt to re establish care. Patient's wife is also a pt of Dr. Timoteo Expose and stated she would love for both to have same pcp. Okay to make patient an appt to re establish care?

## 2023-03-28 NOTE — Telephone Encounter (Signed)
Ok to re establish. Thanks.

## 2023-03-29 NOTE — Telephone Encounter (Signed)
Noted  

## 2023-03-29 NOTE — Telephone Encounter (Signed)
Scheduled patient

## 2023-04-21 ENCOUNTER — Ambulatory Visit (INDEPENDENT_AMBULATORY_CARE_PROVIDER_SITE_OTHER): Payer: Commercial Managed Care - PPO | Admitting: Family Medicine

## 2023-04-21 ENCOUNTER — Encounter: Payer: Self-pay | Admitting: *Deleted

## 2023-04-21 ENCOUNTER — Encounter: Payer: Self-pay | Admitting: Family Medicine

## 2023-04-21 VITALS — BP 156/80 | HR 94 | Temp 97.2°F | Ht 73.0 in | Wt 203.2 lb

## 2023-04-21 DIAGNOSIS — R030 Elevated blood-pressure reading, without diagnosis of hypertension: Secondary | ICD-10-CM | POA: Diagnosis not present

## 2023-04-21 DIAGNOSIS — Z125 Encounter for screening for malignant neoplasm of prostate: Secondary | ICD-10-CM | POA: Diagnosis not present

## 2023-04-21 DIAGNOSIS — L989 Disorder of the skin and subcutaneous tissue, unspecified: Secondary | ICD-10-CM | POA: Insufficient documentation

## 2023-04-21 DIAGNOSIS — K5909 Other constipation: Secondary | ICD-10-CM

## 2023-04-21 DIAGNOSIS — E785 Hyperlipidemia, unspecified: Secondary | ICD-10-CM | POA: Diagnosis not present

## 2023-04-21 DIAGNOSIS — Z1211 Encounter for screening for malignant neoplasm of colon: Secondary | ICD-10-CM

## 2023-04-21 DIAGNOSIS — Z8052 Family history of malignant neoplasm of bladder: Secondary | ICD-10-CM

## 2023-04-21 LAB — COMPREHENSIVE METABOLIC PANEL
ALT: 10 U/L (ref 0–53)
AST: 12 U/L (ref 0–37)
Albumin: 4.5 g/dL (ref 3.5–5.2)
Alkaline Phosphatase: 50 U/L (ref 39–117)
BUN: 7 mg/dL (ref 6–23)
CO2: 28 mEq/L (ref 19–32)
Calcium: 9.4 mg/dL (ref 8.4–10.5)
Chloride: 99 mEq/L (ref 96–112)
Creatinine, Ser: 1.07 mg/dL (ref 0.40–1.50)
GFR: 76.87 mL/min (ref >60.00)
Glucose, Bld: 118 mg/dL — ABNORMAL HIGH (ref 70–99)
Potassium: 4.4 mEq/L (ref 3.5–5.1)
Sodium: 136 mEq/L (ref 135–145)
Total Bilirubin: 0.5 mg/dL (ref 0.2–1.2)
Total Protein: 6.9 g/dL (ref 6.0–8.3)

## 2023-04-21 LAB — PSA: PSA: 0.88 ng/mL (ref 0.10–4.00)

## 2023-04-21 LAB — LIPID PANEL
Cholesterol: 175 mg/dL (ref 0–200)
HDL: 41.7 mg/dL (ref >39.00)
LDL Cholesterol: 116 mg/dL — ABNORMAL HIGH (ref 0–99)
NonHDL: 133.5
Total CHOL/HDL Ratio: 4
Triglycerides: 90 mg/dL (ref 0.0–149.0)
VLDL: 18 mg/dL (ref 0.0–40.0)

## 2023-04-21 LAB — TSH: TSH: 0.59 u[IU]/mL (ref 0.35–5.50)

## 2023-04-21 MED ORDER — POLYETHYLENE GLYCOL 3350 17 GM/SCOOP PO POWD: 8.5000 g | Freq: Every day | ORAL | 0 refills | Status: DC | PRN

## 2023-04-21 NOTE — Assessment & Plan Note (Signed)
In father - nonsmoker. Update UA today.  Pt nonsmoker.

## 2023-04-21 NOTE — Progress Notes (Signed)
Ph: [phone] Fax: [phone]   Patient ID: Duane Hudson, male    DOB: 28-Aug-1966, 57 y.o.   MRN: [id number]  This visit was conducted in person.  BP (!) 156/80 (BP Location: Right Arm, Cuff Size: Normal)   Pulse 94   Temp (!) 97.2 F (36.2 C) (Temporal)   Ht 6\' 1"  (1.854 m)   Wt 203 lb 4 oz (92.2 kg)   SpO2 97%   BMI 26.82 kg/m   BP Readings from Last 3 Encounters:  04/21/23 (!) 156/80  03/16/20 (!) 158/94  09/17/18 140/88    CC: re establish care Subjective:   HPI: Duane Hudson is a 57 y.o. male presenting on 04/21/2023 for Re-establish Care (Here to re-establish care. )   I last saw patient 2019.  BP elevation noted - doesn't check at home. Does note family history of hypertension (father and brother). Tries to limit salt/sodium. No HA, vision changes, CP/tightness, SOB, leg swelling. Episodes a few weeks ago of facial flushing associated with palpitations. BP dropped down to 70/40. Symptoms did improve after large bowel movement.   Notes scab to left side of face that won't heal. Present for about a year. Has been rough for several years. He doesn't regularly wear sunscreen. Has not seen dermatologist. Works on [employer] - lots of sun exposure.   Chronic constipation - manages with metamucil every morning with limited relief.  Non smoker.   Overdue for colonoscopy - agrees to referral.  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 No prostate symptoms.  Bladder cancer screening - fmhx in father.      Relevant past medical, surgical, family and social history reviewed and updated as indicated. Interim medical history since our last visit reviewed. Allergies and medications reviewed and updated. Outpatient Medications Prior to Visit  Medication Sig Dispense Refill   meloxicam (MOBIC) 15 MG tablet Take 15 mg by mouth daily.     Multiple Vitamins-Minerals (CENTRUM MULTI + OMEGA 3) CHEW Chew 1 tablet by  mouth daily.     Probiotic Product (DIGESTIVE ADVANTAGE GUMMIES PO) Take 4 gummy po qd     psyllium (METAMUCIL) 58.6 % packet Take 1 packet by mouth daily.     No facility-administered medications prior to visit.     Per HPI unless specifically indicated in ROS section below Review of Systems  Objective:  BP (!) 156/80 (BP Location: Right Arm, Cuff Size: Normal)   Pulse 94   Temp (!) 97.2 F (36.2 C) (Temporal)   Ht 6\' 1"  (1.854 m)   Wt 203 lb 4 oz (92.2 kg)   SpO2 97%   BMI 26.82 kg/m   Wt Readings from Last 3 Encounters:  04/21/23 203 lb 4 oz (92.2 kg)  03/16/20 212 lb 1.9 oz (96.2 kg)  09/17/18 206 lb 12 oz (93.8 kg)      Physical Exam Vitals and nursing note reviewed.  Constitutional:      Appearance: Normal appearance. He is not ill-appearing.  HENT:     Mouth/Throat:     Mouth: Mucous membranes are moist.     Pharynx: Oropharynx is clear. No oropharyngeal exudate or posterior oropharyngeal erythema.  Eyes:     Extraocular Movements: Extraocular movements intact.     Pupils: Pupils are equal, round, and reactive to light.  Cardiovascular:     Rate and Rhythm: Normal rate and regular rhythm.     Pulses: Normal  pulses.     Heart sounds: Normal heart sounds. No murmur heard. Pulmonary:     Effort: Pulmonary effort is normal. No respiratory distress.     Breath sounds: Normal breath sounds. No wheezing, rhonchi or rales.  Musculoskeletal:     Right lower leg: No edema.     Left lower leg: No edema.  Neurological:     Mental Status: He is alert.  Psychiatric:        Mood and Affect: Mood normal.        Behavior: Behavior normal.       Results for orders placed or performed in visit on 03/16/20  CBC with Differential  Result Value Ref Range   WBC 6.1 4.0 - 10.5 K/uL   RBC 4.66 4.22 - 5.81 Mil/uL   Hemoglobin 14.7 13.0 - 17.0 g/dL   HCT 16.1 09.6 - 04.5 %   MCV 88.5 78.0 - 100.0 fl   MCHC 35.8 30.0 - 36.0 g/dL   RDW 40.9 81.1 - 91.4 %   Platelets 276.0  150.0 - 400.0 K/uL   Neutrophils Relative % 72.3 43.0 - 77.0 %   Lymphocytes Relative 20.4 12.0 - 46.0 %   Monocytes Relative 5.8 3.0 - 12.0 %   Eosinophils Relative 1.1 0.0 - 5.0 %   Basophils Relative 0.4 0.0 - 3.0 %   Neutro Abs 4.4 1.4 - 7.7 K/uL   Lymphs Abs 1.3 0.7 - 4.0 K/uL   Monocytes Absolute 0.4 0.1 - 1.0 K/uL   Eosinophils Absolute 0.1 0.0 - 0.7 K/uL   Basophils Absolute 0.0 0.0 - 0.1 K/uL  Comprehensive metabolic panel  Result Value Ref Range   Sodium 133 (L) 135 - 145 mEq/L   Potassium 4.3 3.5 - 5.1 mEq/L   Chloride 99 96 - 112 mEq/L   CO2 27 19 - 32 mEq/L   Glucose, Bld 122 (H) 70 - 99 mg/dL   BUN 9 6 - 23 mg/dL   Creatinine, Ser 7.82 0.40 - 1.50 mg/dL   Total Bilirubin 0.8 0.2 - 1.2 mg/dL   Alkaline Phosphatase 46 39 - 117 U/L   AST 13 0 - 37 U/L   ALT 15 0 - 53 U/L   Total Protein 6.8 6.0 - 8.3 g/dL   Albumin 4.6 3.5 - 5.2 g/dL   GFR 95.62 >13.08 mL/min   Calcium 9.0 8.4 - 10.5 mg/dL    Assessment & Plan:   Problem List Items Addressed This Visit     Dyslipidemia    Update FLP as fasting.       Relevant Orders   Lipid panel   Comprehensive metabolic panel   Elevated blood pressure reading in office without diagnosis of hypertension - Primary    BP improved on retesting however remaining high. Reviewed diet and lifestyle choices to improve blood pressure control.  BP log provided to monitor at home.  DASH diet handout provided today. Reassess at 3 mo f/u/CPE      Relevant Orders   Comprehensive metabolic panel   TSH   Chronic constipation    Reviewed bowel regimen.  Metamucil not controlling symptoms. Trial Miralax - sent to pharmacy.       Relevant Medications   polyethylene glycol powder (GLYCOLAX/MIRALAX) 17 GM/SCOOP powder   Skin lesion of cheek    Anticipate AK vs small SCC. We don't have LN2 available at this time.  Refer to derm for eval - pt requests [place].       Relevant Orders  Ambulatory referral to Dermatology   Family  history of bladder cancer    In father - nonsmoker. Update UA today.  Pt nonsmoker.      Relevant Orders   Urinalysis, Routine w reflex microscopic   Other Visit Diagnoses     Special screening for malignant neoplasm of prostate       Relevant Orders   PSA   Special screening for malignant neoplasms, colon       Relevant Orders   Ambulatory referral to Gastroenterology        Meds ordered this encounter  Medications   polyethylene glycol powder (GLYCOLAX/MIRALAX) 17 GM/SCOOP powder    Sig: Take 8.5-17 g by mouth daily as needed for moderate constipation (hold for diarrhea).    Dispense:  3350 g    Refill:  0    Orders Placed This Encounter  Procedures   Lipid panel   Comprehensive metabolic panel   TSH   PSA   Urinalysis, Routine w reflex microscopic   Ambulatory referral to Dermatology    Referral Priority:   Routine    Referral Type:   Consultation    Referral Reason:   Specialty Services Required    Requested Specialty:   Dermatology    Number of Visits Requested:   1   Ambulatory referral to Gastroenterology    Referral Priority:   Routine    Referral Type:   Consultation    Referral Reason:   Specialty Services Required    Number of Visits Requested:   1    Patient Instructions  Labs today urine test today.  We will refer you to [facility] dermatology. We will refer you back to Dr Rhea Belton for repeat colonoscopy  BP was too high - start monitoring blood pressures at home, goal <140/90. Bp log provided. Bring in to next visit Work on low salt/sodium diet - goal <2 grams (2,000mg ) per day. Eat a diet high in fruits/vegetables and whole grains.  Look into mediterranean and DASH diet.  Goal activity is 193min/wk of moderate intensity exercise.  This can be split into 30 minute chunks.  If you are not at this level, you can start with smaller 10-15 min increments and slowly build up activity. Look at www.heart.org for more resources   For constipation - try  miralax 1/2-1 capful daily as needed for constipation, hold for diarrhea. Ensure good fiber and water in the diet. Consider prunes, kiwi fruits which can help constipation.  Return in 3 months for blood pressure follow up/physical.   Follow up plan: Return in about 3 months (around 07/22/2023) for annual exam, prior fasting for blood work.  Eustaquio Boyden, MD

## 2023-04-21 NOTE — Assessment & Plan Note (Signed)
BP improved on retesting however remaining high. Reviewed diet and lifestyle choices to improve blood pressure control.  BP log provided to monitor at home.  DASH diet handout provided today. Reassess at 3 mo f/u/CPE

## 2023-04-21 NOTE — Assessment & Plan Note (Signed)
Reviewed bowel regimen.  Metamucil not controlling symptoms. Trial Miralax - sent to pharmacy.

## 2023-04-21 NOTE — Assessment & Plan Note (Signed)
Update FLP as fasting.  

## 2023-04-21 NOTE — Assessment & Plan Note (Signed)
Anticipate AK vs small SCC. We don't have LN2 available at this time.  Refer to derm for eval - pt requests [place].

## 2023-04-21 NOTE — Patient Instructions (Addendum)
Labs today urine test today.  We will refer you to [facility] dermatology. We will refer you back to Dr Rhea Belton for repeat colonoscopy  BP was too high - start monitoring blood pressures at home, goal <140/90. Bp log provided. Bring in to next visit Work on low salt/sodium diet - goal <2 grams (2,000mg ) per day. Eat a diet high in fruits/vegetables and whole grains.  Look into mediterranean and DASH diet.  Goal activity is 117min/wk of moderate intensity exercise.  This can be split into 30 minute chunks.  If you are not at this level, you can start with smaller 10-15 min increments and slowly build up activity. Look at www.heart.org for more resources   For constipation - try miralax 1/2-1 capful daily as needed for constipation, hold for diarrhea. Ensure good fiber and water in the diet. Consider prunes, kiwi fruits which can help constipation.  Return in 3 months for blood pressure follow up/physical.

## 2023-04-24 NOTE — Addendum Note (Signed)
Addended by: Lovena Neighbours on: 04/24/2023 11:29 AM   Modules accepted: Orders

## 2023-04-26 ENCOUNTER — Encounter: Payer: Self-pay | Admitting: Internal Medicine

## 2023-05-22 ENCOUNTER — Other Ambulatory Visit (INDEPENDENT_AMBULATORY_CARE_PROVIDER_SITE_OTHER): Payer: Commercial Managed Care - PPO

## 2023-05-22 DIAGNOSIS — Z8052 Family history of malignant neoplasm of bladder: Secondary | ICD-10-CM

## 2023-05-22 LAB — URINALYSIS, ROUTINE W REFLEX MICROSCOPIC
Bilirubin Urine: NEGATIVE
Hgb urine dipstick: NEGATIVE
Ketones, ur: NEGATIVE
Leukocytes,Ua: NEGATIVE
Nitrite: NEGATIVE
RBC / HPF: NONE SEEN (ref 0–?)
Specific Gravity, Urine: <=1.005 — AB (ref 1.000–1.030)
Total Protein, Urine: NEGATIVE
Urine Glucose: NEGATIVE
Urobilinogen, UA: 0.2 (ref 0.0–1.0)
WBC, UA: NONE SEEN (ref 0–?)
pH: 6.5 (ref 5.0–8.0)

## 2023-06-22 ENCOUNTER — Encounter: Payer: Commercial Managed Care - PPO | Admitting: Internal Medicine

## 2023-07-02 HISTORY — PX: COLONOSCOPY: SHX174

## 2023-07-14 ENCOUNTER — Ambulatory Visit: Payer: Commercial Managed Care - PPO | Admitting: *Deleted

## 2023-07-14 VITALS — Ht 74.0 in | Wt 182.0 lb

## 2023-07-14 DIAGNOSIS — Z1211 Encounter for screening for malignant neoplasm of colon: Secondary | ICD-10-CM

## 2023-07-14 MED ORDER — NA SULFATE-K SULFATE-MG SULF 17.5-3.13-1.6 GM/177ML PO SOLN: 1.0000 | Freq: Once | ORAL | 0 refills | Status: AC

## 2023-07-14 NOTE — Progress Notes (Signed)

## 2023-07-20 ENCOUNTER — Encounter: Payer: Self-pay | Admitting: Internal Medicine

## 2023-07-28 ENCOUNTER — Encounter: Payer: Self-pay | Admitting: Internal Medicine

## 2023-07-28 ENCOUNTER — Ambulatory Visit (AMBULATORY_SURGERY_CENTER): Payer: Commercial Managed Care - PPO | Admitting: Internal Medicine

## 2023-07-28 VITALS — BP 100/70 | HR 65 | Temp 97.6°F | Resp 11 | Ht 73.0 in | Wt 182.0 lb

## 2023-07-28 DIAGNOSIS — Z09 Encounter for follow-up examination after completed treatment for conditions other than malignant neoplasm: Secondary | ICD-10-CM | POA: Diagnosis present

## 2023-07-28 DIAGNOSIS — Z86010 Personal history of colonic polyps: Secondary | ICD-10-CM | POA: Diagnosis not present

## 2023-07-28 DIAGNOSIS — D125 Benign neoplasm of sigmoid colon: Secondary | ICD-10-CM

## 2023-07-28 DIAGNOSIS — K635 Polyp of colon: Secondary | ICD-10-CM | POA: Diagnosis not present

## 2023-07-28 DIAGNOSIS — Z1211 Encounter for screening for malignant neoplasm of colon: Secondary | ICD-10-CM

## 2023-07-28 DIAGNOSIS — D122 Benign neoplasm of ascending colon: Secondary | ICD-10-CM | POA: Diagnosis not present

## 2023-07-28 DIAGNOSIS — D124 Benign neoplasm of descending colon: Secondary | ICD-10-CM

## 2023-07-28 MED ORDER — SODIUM CHLORIDE 0.9 % IV SOLN: 500.0000 mL | Freq: Once | INTRAVENOUS | Status: DC

## 2023-07-28 NOTE — Patient Instructions (Signed)
Resume previous diet Continue present medications Await pathology results  Handouts/information given for polyp and hemorrhoids  YOU HAD AN ENDOSCOPIC PROCEDURE TODAY AT THE [facility]:   Refer to the procedure report that was given to you for any specific questions about what was found during the examination.  If the procedure report does not answer your questions, please call your gastroenterologist to clarify.  If you requested that your care partner not be given the details of your procedure findings, then the procedure report has been included in a sealed envelope for you to review at your convenience later.  YOU SHOULD EXPECT: Some feelings of bloating in the abdomen. Passage of more gas than usual.  Walking can help get rid of the air that was put into your GI tract during the procedure and reduce the bloating. If you had a lower endoscopy (such as a colonoscopy or flexible sigmoidoscopy) you may notice spotting of blood in your stool or on the toilet paper. If you underwent a bowel prep for your procedure, you may not have a normal bowel movement for a few days.  Please Note:  You might notice some irritation and congestion in your nose or some drainage.  This is from the oxygen used during your procedure.  There is no need for concern and it should clear up in a day or so.  SYMPTOMS TO REPORT IMMEDIATELY:  Following lower endoscopy (colonoscopy):  Excessive amounts of blood in the stool  Significant tenderness or worsening of abdominal pains  Swelling of the abdomen that is new, acute  Fever of 100F or higher  For urgent or emergent issues, a gastroenterologist can be reached at any hour by calling [phone]. Do not use MyChart messaging for urgent concerns.   DIET:  We do recommend a small meal at first, but then you may proceed to your regular diet.  Drink plenty of fluids but you should avoid alcoholic beverages for 24 hours.  ACTIVITY:  You should plan to  take it easy for the rest of today and you should NOT DRIVE or use heavy machinery until tomorrow (because of the sedation medicines used during the test).    FOLLOW UP: Our staff will call the number listed on your records the next business day following your procedure.  We will call around 7:15- 8:00 am to check on you and address any questions or concerns that you may have regarding the information given to you following your procedure. If we do not reach you, we will leave a message.     If any biopsies were taken you will be contacted by phone or by letter within the next 1-3 weeks.  Please call us at [phone] if you have not heard about the biopsies in 3 weeks.    SIGNATURES/CONFIDENTIALITY: You and/or your care partner have signed paperwork which will be entered into your electronic medical record.  These signatures attest to the fact that that the information above on your After Visit Summary has been reviewed and is understood.  Full responsibility of the confidentiality of this discharge information lies with you and/or your care-partner.

## 2023-07-28 NOTE — Progress Notes (Signed)
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  The nature of the procedure, as well as the risks, benefits, and alternatives were carefully and thoroughly reviewed with the patient. Ample time for discussion and questions allowed. The patient understood, was satisfied, and agreed to proceed.     HPI: Duane Hudson is a 57 y.o. male who presents for colonoscopy.  Medical history as below.  Tolerated the prep.  No recent chest pain or shortness of breath.  No abdominal pain today.  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 Authorizing Provider  meloxicam (MOBIC) 15 MG tablet Take 15 mg by mouth daily. 04/10/23  Yes [provider]  Multiple Vitamins-Minerals (CENTRUM MULTI + OMEGA 3) CHEW Chew 1 tablet by mouth daily.   Yes [provider]  polyethylene glycol powder (GLYCOLAX/MIRALAX) 17 GM/SCOOP powder Take 8.5-17 g by mouth daily as needed for moderate constipation (hold for diarrhea). 04/21/23  Yes Eustaquio Boyden, MD  Probiotic Product (DIGESTIVE ADVANTAGE GUMMIES PO) Take 4 gummy po qd   Yes [provider]  psyllium (METAMUCIL) 58.6 % packet Take 1 packet by mouth daily.   Yes [provider]    Current Outpatient Medications  Medication Sig Dispense Refill   meloxicam (MOBIC) 15 MG tablet Take 15 mg by mouth daily.     Multiple Vitamins-Minerals (CENTRUM MULTI + OMEGA 3) CHEW Chew 1 tablet by mouth daily.      polyethylene glycol powder (GLYCOLAX/MIRALAX) 17 GM/SCOOP powder Take 8.5-17 g by mouth daily as needed for moderate constipation (hold for diarrhea). 3350 g 0   Probiotic Product (DIGESTIVE ADVANTAGE GUMMIES PO) Take 4 gummy po qd     psyllium (METAMUCIL) 58.6 % packet Take 1 packet by mouth daily.     Current Facility-Administered Medications  Medication Dose Route Frequency Provider Last Rate Last Admin   0.9 %  sodium chloride infusion  500 mL Intravenous Once Yoshua Geisinger, Carie Caddy, MD        Allergies as of 07/28/2023 - Review Complete 07/28/2023  Allergen Reaction Noted   Penicillins Rash 04/05/2016    Family History  Problem Relation Age of Onset   Ovarian cancer Mother 8   Bladder Cancer Father    Kidney cancer Father    Hypertension Father    Stroke Father    Diabetes Father    Factor V Leiden deficiency Father    CAD Brother 64       stent   Diabetes Brother    Cancer Paternal Aunt        ?ovarian   Factor V Leiden deficiency Cousin    Colon cancer Neg Hx    Colon polyps Neg Hx    Crohn's disease Neg Hx    Esophageal cancer Neg Hx    Rectal cancer Neg Hx    Stomach cancer Neg Hx    Ulcerative colitis Neg Hx     Social History  Socioeconomic History   Marital status: Married    Spouse name: Not on file   Number of children: Not on file   Years of education: Not on file   Highest education level: Associate degree: occupational, Scientist, product/process development, or vocational program  Occupational History   Not on file  Tobacco Use   Smoking status: Former    Current packs/day: 0.00    Average packs/day: 1 pack/day for 22.4 years (22.4 ttl pk-yrs)    Types: Cigarettes    Start date: 11/01/1983    Quit date: 04/05/2006    Years since quitting: 17.3   Smokeless tobacco: Former   Tobacco comments:    PT SMOKED 1 PPD FOR ABOUT 15 YEARS  Vaping Use   Vaping status: Never Used  Substance and Sexual Activity   Alcohol use: No    Alcohol/week: 0.0 standard drinks of alcohol    Comment:  lastd drink 06/26/2016 (6 pack/day prior)   Drug use: No   Sexual activity: Not on file  Other Topics Concern   Not on file  Social History Narrative   Lives with wife, 5 goats, 5 cats, 2 dogs, [place]   Occupation: Engineer, site - commercial and industrial   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   Activity: works out in am, treadmill or bike in pm, enjoys [employer]   Diet: good water, fruits/vegetables poorly   Social Determinants of Corporate investment banker Strain: Low Risk  (04/20/2023)   Overall Financial Resource Strain (CARDIA)    Difficulty of Paying Living Expenses: Not hard at all  Food Insecurity: No Food Insecurity (04/20/2023)   Hunger Vital Sign    Worried About Running Out of Food in the Last Year: Never true    Ran Out of Food in the Last Year: Never true  Transportation Needs: No Transportation Needs (04/20/2023)   PRAPARE - Administrator, Civil Service (Medical): No    Lack of Transportation (Non-Medical): No  Physical Activity: Sufficiently Active (04/20/2023)   Exercise Vital Sign    Days of Exercise per Week: 7 days    Minutes of Exercise per Session: 30 min  Stress: No Stress Concern Present (04/20/2023)   [employer] of Occupational Health - Occupational Stress Questionnaire    Feeling of Stress : Not at all  Social Connections: Moderately Integrated (04/20/2023)   Social Connection and Isolation Panel [NHANES]    Frequency of Communication with Friends and Family: More than three times a week    Frequency of Social Gatherings with Friends and Family: More than three times a week    Attends Religious Services: 1 to 4 times per year    Active Member of [employer] or Organizations: No    Attends Engineer, structural: Not on file    Marital Status: Married  Catering manager Violence: Not on file    Physical Exam: Vital signs in last 24 hours: @BP  (!) 136/92   Pulse 94   Temp 97.6 F (36.4 C)   Ht 6\' 1"  (1.854 m)   Wt 182 lb (82.6  kg)   SpO2 100%   BMI 24.01 kg/m  GEN: NAD EYE: Sclerae anicteric ENT: MMM CV: Non-tachycardic Pulm: CTA b/l GI: Soft, NT/ND NEURO:  Alert & Oriented x 3   Erick Blinks, MD [facility]  07/28/2023 11:07 AM

## 2023-07-28 NOTE — Progress Notes (Signed)
Pt's states no medical or surgical changes since previsit or office visit. 

## 2023-07-28 NOTE — Progress Notes (Signed)
Called to room to assist during endoscopic procedure.  Patient ID and intended procedure confirmed with present staff. Received instructions for my participation in the procedure from the performing physician.  

## 2023-07-28 NOTE — Progress Notes (Signed)
Uneventful anesthetic. Report to pacu rn. Vss. Care resumed by rn. 

## 2023-07-28 NOTE — Op Note (Signed)
[facility] Patient Name: Duane Hudson Procedure Date: 07/28/2023 11:02 AM MRN: [id number] Endoscopist: Beverley Fiedler , MD, [id number] Age: 57 Referring MD:  Date of Birth: Dec 04, 1965 Gender: Male Account #: [id number] Procedure:                Colonoscopy Indications:              High risk colon cancer surveillance: Personal                            history of presumed sessile serrated colon polyp                            (less than 10 mm in size) with no dysplasia, Last                            colonoscopy: August 2017 (hepatic flexure mucus cap                            polyp) Medicines:                Monitored Anesthesia Care Procedure:                Pre-Anesthesia Assessment:                           - Prior to the procedure, a History and Physical                            was performed, and patient medications and                            allergies were reviewed. The patient's tolerance of                            previous anesthesia was also reviewed. The risks                            and benefits of the procedure and the sedation                            options and risks were discussed with the patient.                            All questions were answered, and informed consent                            was obtained. Prior Anticoagulants: The patient has                            taken no anticoagulant or antiplatelet agents. ASA                            Grade Assessment: II - A patient with mild systemic  disease. After reviewing the risks and benefits,                            the patient was deemed in satisfactory condition to                            undergo the procedure.                           After obtaining informed consent, the colonoscope                            was passed under direct vision. Throughout the                            procedure, the patient's blood pressure, pulse, and                             oxygen saturations were monitored continuously. The                            Olympus Scope SN: [id number] was introduced through                            the anus and advanced to the cecum, identified by                            appendiceal orifice and ileocecal valve. The                            colonoscopy was performed without difficulty. The                            patient tolerated the procedure well. The quality                            of the bowel preparation was good. The ileocecal                            valve, appendiceal orifice, and rectum were                            photographed. Scope In: 11:16:54 AM Scope Out: 11:31:44 AM Scope Withdrawal Time: 0 hours 11 minutes 31 seconds  Total Procedure Duration: 0 hours 14 minutes 50 seconds  Findings:                 The digital rectal exam was normal.                           A 4 mm polyp was found in the ascending colon. The                            polyp was sessile. The polyp was removed with a  cold snare. Resection and retrieval were complete.                           A 6 mm polyp was found in the descending colon. The                            polyp was sessile. The polyp was removed with a                            cold snare. Resection and retrieval were complete.                           A 6 mm polyp was found in the sigmoid colon. The                            polyp was sessile. The polyp was removed with a                            cold snare. Resection and retrieval were complete.                           Internal hemorrhoids were found during                            retroflexion. The hemorrhoids were small. Complications:            No immediate complications. Estimated Blood Loss:     Estimated blood loss was minimal. Impression:               - One 4 mm polyp in the ascending colon, removed                            with a cold snare.  Resected and retrieved.                           - One 6 mm polyp in the descending colon, removed                            with a cold snare. Resected and retrieved.                           - One 6 mm polyp in the sigmoid colon, removed with                            a cold snare. Resected and retrieved.                           - Small internal hemorrhoids. Recommendation:           - Patient has a contact number available for                            emergencies. The signs and symptoms of potential  delayed complications were discussed with the                            patient. Return to normal activities tomorrow.                            Written discharge instructions were provided to the                            patient.                           - Resume previous diet.                           - Continue present medications.                           - Await pathology results.                           - Repeat colonoscopy is recommended for                            surveillance. The colonoscopy date will be                            determined after pathology results from today's                            exam become available for review. Beverley Fiedler, MD 07/28/2023 11:34:22 AM This report has been signed electronically.

## 2023-07-29 ENCOUNTER — Encounter: Payer: Self-pay | Admitting: Family Medicine

## 2023-07-29 ENCOUNTER — Other Ambulatory Visit: Payer: Self-pay | Admitting: Family Medicine

## 2023-07-29 DIAGNOSIS — Z1159 Encounter for screening for other viral diseases: Secondary | ICD-10-CM

## 2023-07-29 DIAGNOSIS — R739 Hyperglycemia, unspecified: Secondary | ICD-10-CM

## 2023-07-31 ENCOUNTER — Telehealth: Payer: Self-pay

## 2023-07-31 NOTE — Telephone Encounter (Signed)
  Follow up Call-     07/28/2023   10:18 AM  Call back number  Post procedure Call Back phone  # [phone]  Permission to leave phone message Yes     Patient questions:  Do you have a fever, pain , or abdominal swelling? No. Pain Score  0 *  Have you tolerated food without any problems? Yes.    Have you been able to return to your normal activities? Yes.    Do you have any questions about your discharge instructions: Diet   No. Medications  No. Follow up visit  No.  Do you have questions or concerns about your Care? No.  Actions: * If pain score is 4 or above: No action needed, pain <4.

## 2023-08-02 LAB — SURGICAL PATHOLOGY

## 2023-08-04 ENCOUNTER — Other Ambulatory Visit (INDEPENDENT_AMBULATORY_CARE_PROVIDER_SITE_OTHER): Payer: Commercial Managed Care - PPO

## 2023-08-04 DIAGNOSIS — Z1159 Encounter for screening for other viral diseases: Secondary | ICD-10-CM | POA: Diagnosis not present

## 2023-08-04 DIAGNOSIS — R739 Hyperglycemia, unspecified: Secondary | ICD-10-CM

## 2023-08-04 LAB — BASIC METABOLIC PANEL
BUN: 12 mg/dL (ref 6–23)
CO2: 28 meq/L (ref 19–32)
Calcium: 9.1 mg/dL (ref 8.4–10.5)
Chloride: 99 meq/L (ref 96–112)
Creatinine, Ser: 1.01 mg/dL (ref 0.40–1.50)
GFR: 82.22 mL/min (ref >60.00)
Glucose, Bld: 107 mg/dL — ABNORMAL HIGH (ref 70–99)
Potassium: 4.5 meq/L (ref 3.5–5.1)
Sodium: 135 meq/L (ref 135–145)

## 2023-08-04 LAB — HEMOGLOBIN A1C: Hgb A1c MFr Bld: 5.5 % (ref 4.6–6.5)

## 2023-08-05 LAB — HEPATITIS C ANTIBODY: Hepatitis C Ab: NONREACTIVE

## 2023-08-09 ENCOUNTER — Encounter: Payer: Self-pay | Admitting: Internal Medicine

## 2023-08-11 ENCOUNTER — Ambulatory Visit (INDEPENDENT_AMBULATORY_CARE_PROVIDER_SITE_OTHER): Payer: Commercial Managed Care - PPO | Admitting: Family Medicine

## 2023-08-11 ENCOUNTER — Encounter: Payer: Self-pay | Admitting: Family Medicine

## 2023-08-11 VITALS — BP 132/86 | HR 86 | Temp 98.2°F | Ht 73.5 in | Wt 181.0 lb

## 2023-08-11 DIAGNOSIS — E785 Hyperlipidemia, unspecified: Secondary | ICD-10-CM

## 2023-08-11 DIAGNOSIS — H9190 Unspecified hearing loss, unspecified ear: Secondary | ICD-10-CM

## 2023-08-11 DIAGNOSIS — Z0000 Encounter for general adult medical examination without abnormal findings: Secondary | ICD-10-CM

## 2023-08-11 DIAGNOSIS — R739 Hyperglycemia, unspecified: Secondary | ICD-10-CM

## 2023-08-11 NOTE — Patient Instructions (Addendum)
Good to see you today Return as needed or in 1 year for next physical

## 2023-08-11 NOTE — Assessment & Plan Note (Addendum)
Preventative protocols reviewed and updated unless pt declined. Discussed healthy diet and lifestyle.  Declines vaccines.

## 2023-08-11 NOTE — Assessment & Plan Note (Signed)
Wears hearing aides 

## 2023-08-11 NOTE — Assessment & Plan Note (Signed)
Encouraged limiting added sugar in diet.  

## 2023-08-11 NOTE — Assessment & Plan Note (Addendum)
Chronic, stable off medication.  The 10-year ASCVD risk score (Arnett DK, et al., 2019) is: 7.7%   Values used to calculate the score:     Age: 57 years     Sex: Male     Is Non-Hispanic African American: No     Diabetic: No     Tobacco smoker: No     Systolic Blood Pressure: 132 mmHg     Is BP treated: No     HDL Cholesterol: 41.7 mg/dL     Total Cholesterol: 175 mg/dL

## 2023-08-11 NOTE — Progress Notes (Signed)
Ph: [phone] Fax: [phone]   Patient ID: Duane Hudson, male    DOB: 12/29/1965, 57 y.o.   MRN: [id number]  This visit was conducted in person.  BP 132/86 (BP Location: Left Arm, Patient Position: Sitting, Cuff Size: Large)   Pulse 86   Temp 98.2 F (36.8 C) (Oral)   Ht 6' 1.5" (1.867 m)   Wt 181 lb (82.1 kg)   SpO2 97%   BMI 23.56 kg/m    CC: CPE Subjective:   HPI: Duane Hudson is a 57 y.o. male presenting on 08/11/2023 for Annual Exam   Brings BP log - 100s-130s/60-80s.  He's been following modified ketodiet with noted weight loss.   Preventative: COLONOSCOPY 07/2023 - SSP x2, int hem, rpt 3 yrs (Pyrtle)  Prostate cancer - yearly PSA. Nocturia x1-2.  Lung cancer screen - not eligible  Flu shot declines Tetanus shot - unsure. Declines.  Seat belt use discussed Sunscreen use discussed. No changing moles on skin.  Sleep - averaging 8 hours/night Ex smoker, quit 2007, 15 PY hx.  Alcohol - none Dentist - doesn't see. 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 Interim medical history since our last visit reviewed. Allergies and medications reviewed and updated. Outpatient Medications Prior to Visit  Medication Sig Dispense Refill   meloxicam (MOBIC) 15 MG tablet Take 15 mg by mouth daily.     Multiple Vitamins-Minerals (CENTRUM MULTI + OMEGA 3) CHEW Chew 1 tablet by mouth daily.     polyethylene glycol powder (GLYCOLAX/MIRALAX) 17 GM/SCOOP powder Take 8.5-17 g by mouth daily as needed for moderate constipation (hold for diarrhea). 3350 g 0   Probiotic Product (DIGESTIVE ADVANTAGE GUMMIES PO) Take 4 gummy po qd      psyllium (METAMUCIL) 58.6 % packet Take 1 packet by mouth daily.     No facility-administered medications prior to visit.     Per HPI unless specifically indicated in ROS section below Review of Systems  Constitutional:  Negative for activity change, appetite change, chills, fatigue, fever and unexpected weight change.  HENT:  Negative for hearing loss.   Eyes:  Negative for visual disturbance.  Respiratory:  Negative for cough, chest tightness, shortness of breath and wheezing.   Cardiovascular:  Negative for chest pain, palpitations and leg swelling.  Gastrointestinal:  Negative for abdominal distention, abdominal pain, blood in stool, constipation, diarrhea, nausea and vomiting.  Genitourinary:  Negative for difficulty urinating and hematuria.  Musculoskeletal:  Negative for arthralgias, myalgias and neck pain.  Skin:  Negative for rash.  Neurological:  Negative for dizziness, seizures, syncope and headaches.  Hematological:  Negative for adenopathy. Does not bruise/bleed easily.  Psychiatric/Behavioral:  Negative for dysphoric mood. The patient is not nervous/anxious.     Objective:  BP 132/86 (BP Location: Left Arm, Patient Position: Sitting, Cuff Size: Large)   Pulse 86   Temp 98.2 F (36.8 C) (Oral)   Ht 6' 1.5" (1.867 m)   Wt 181 lb (82.1 kg)   SpO2 97%   BMI 23.56 kg/m   Wt Readings from Last 3 Encounters:  08/11/23 181 lb (82.1 kg)  07/28/23 182 lb (82.6 kg)  07/14/23 182 lb (  82.6 kg)      Physical Exam Vitals and nursing note reviewed.  Constitutional:      General: He is not in acute distress.    Appearance: Normal appearance. He is well-developed. He is not ill-appearing.  HENT:     Head: Normocephalic and atraumatic.     Right Ear: Hearing, tympanic membrane, ear canal and external ear normal.     Left Ear: Hearing, tympanic membrane, ear canal and external ear normal.     Mouth/Throat:     Mouth: Mucous membranes are moist.     Pharynx: Oropharynx is  clear. No oropharyngeal exudate or posterior oropharyngeal erythema.  Eyes:     General: No scleral icterus.    Extraocular Movements: Extraocular movements intact.     Conjunctiva/sclera: Conjunctivae normal.     Pupils: Pupils are equal, round, and reactive to light.  Neck:     Thyroid: No thyroid mass or thyromegaly.  Cardiovascular:     Rate and Rhythm: Normal rate and regular rhythm.     Pulses: Normal pulses.          Radial pulses are 2+ on the right side and 2+ on the left side.     Heart sounds: Normal heart sounds. No murmur heard. Pulmonary:     Effort: Pulmonary effort is normal. No respiratory distress.     Breath sounds: Normal breath sounds. No wheezing, rhonchi or rales.  Abdominal:     General: Bowel sounds are normal. There is no distension.     Palpations: Abdomen is soft. There is no mass.     Tenderness: There is no abdominal tenderness. There is no guarding or rebound.     Hernia: No hernia is present.  Musculoskeletal:        General: Normal range of motion.     Cervical back: Normal range of motion and neck supple.     Right lower leg: No edema.     Left lower leg: No edema.  Lymphadenopathy:     Cervical: No cervical adenopathy.  Skin:    General: Skin is warm and dry.     Findings: No rash.  Neurological:     General: No focal deficit present.     Mental Status: He is alert and oriented to person, place, and time.  Psychiatric:        Mood and Affect: Mood normal.        Behavior: Behavior normal.        Thought Content: Thought content normal.        Judgment: Judgment normal.       Results for orders placed or performed in visit on 08/04/23  Hepatitis C antibody  Result Value Ref Range   Hepatitis C Ab NON-REACTIVE NON-REACTIVE  Hemoglobin A1c  Result Value Ref Range   Hgb A1c MFr Bld 5.5 4.6 - 6.5 %  Basic metabolic panel  Result Value Ref Range   Sodium 135 135 - 145 mEq/L   Potassium 4.5 3.5 - 5.1 mEq/L   Chloride 99 96 - 112 mEq/L    CO2 28 19 - 32 mEq/L   Glucose, Bld 107 (H) 70 - 99 mg/dL   BUN 12 6 - 23 mg/dL   Creatinine, Ser 1.02 0.40 - 1.50 mg/dL   GFR 72.53 >66.44 mL/min   Calcium 9.1 8.4 - 10.5 mg/dL   Lab Results  Component Value Date   PSA 0.88 04/21/2023   PSA 1.07 09/06/2017   PSA 1.23 07/08/2016  Lab Results  Component Value Date   CHOL 175 04/21/2023   HDL 41.70 04/21/2023   LDLCALC 116 (H) 04/21/2023   TRIG 90.0 04/21/2023   CHOLHDL 4 04/21/2023    Assessment & Plan:   Problem List Items Addressed This Visit     Health maintenance examination - Primary (Chronic)    Preventative protocols reviewed and updated unless pt declined. Discussed healthy diet and lifestyle.  Declines vaccines.       Hard of hearing    Wears hearing aides      Hyperglycemia    Encouraged limiting added sugar in diet.       Dyslipidemia    Chronic, stable off medication.  The 10-year ASCVD risk score (Arnett DK, et al., 2019) is: 7.7%   Values used to calculate the score:     Age: 85 years     Sex: Male     Is Non-Hispanic African American: No     Diabetic: No     Tobacco smoker: No     Systolic Blood Pressure: 132 mmHg     Is BP treated: No     HDL Cholesterol: 41.7 mg/dL     Total Cholesterol: 175 mg/dL         No orders of the defined types were placed in this encounter.   No orders of the defined types were placed in this encounter.   Patient Instructions  Good to see you today Return as needed or in 1 year for next physical   Follow up plan: Return in about 1 year (around 08/10/2024) for annual exam, prior fasting for blood work.  Eustaquio Boyden, MD

## 2023-10-22 ENCOUNTER — Other Ambulatory Visit: Payer: Self-pay | Admitting: Family Medicine

## 2023-10-22 DIAGNOSIS — K5909 Other constipation: Secondary | ICD-10-CM

## 2023-10-23 NOTE — Telephone Encounter (Signed)
ERx 

## 2023-10-23 NOTE — Telephone Encounter (Signed)
Miralax Last filled:  09/26/23, #3350 g Last OV:  08/11/23, CPE Next OV:  08/16/24, CPE

## 2024-04-30 ENCOUNTER — Ambulatory Visit: Payer: Self-pay

## 2024-04-30 ENCOUNTER — Ambulatory Visit: Payer: Self-pay | Admitting: *Deleted

## 2024-04-30 NOTE — Telephone Encounter (Signed)
 Unable to reach pt or pts wife (DPR signed) left v/m requesting pt to cb [phone] to be triaged.sending note to [facility] triage and [facility] pool.

## 2024-04-30 NOTE — Telephone Encounter (Signed)
 First attempt: LVM for patient to return call to [phone]    Copied from CRM [id number]. Topic: Clinical - Red Word Triage >> Apr 30, 2024 12:49 PM Drema MATSU wrote: Red Word that prompted transfer to Nurse Triage: Patient is having low back pain, right hip, knee, and ankle pain. He said that it is very aggravating.    ----------------------------------------------------------------------- From previous Reason for Contact - Scheduling: Patient/patient representative is calling to schedule an appointment. Refer to attachments for appointment information.

## 2024-04-30 NOTE — Telephone Encounter (Signed)
 3rd attempt to contact patient and / or wife on DPR to review sx. No answer, LVMTCB [phone]. See previous encounter from NT from today   FYI Only or Action Required?: Action required by provider: clinical question for provider.  Patient was last seen in primary care on 08/11/2023 by Rilla Baller, MD. Called Nurse Triage reporting Back Pain. Symptoms began unknown. Interventions attempted: Other: na. Symptoms are: unknown.  Triage Disposition: No Contact Calls  Patient/caregiver understands and will follow disposition?: No, wishes to speak with PCP    Reason for Disposition  Third attempt to contact caller AND no contact made. Phone number verified.  Answer Assessment - Initial Assessment Questions N/A Attempted to contact patient's wife on DPR to review sx of low back pain, right hip, knee and ankle pain. No answer on 3rd attempt with patient / wife #. LVMTCB [phone].  Protocols used: No Contact or Duplicate Contact Call-A-AH

## 2024-04-30 NOTE — Telephone Encounter (Signed)
 2nd attempt to contact patient regarding sx of low back, right hip, knee and ankle pain. No answer, LVMTCB [phone].

## 2024-05-01 NOTE — Telephone Encounter (Signed)
 Noted. Will see then.

## 2024-05-01 NOTE — Telephone Encounter (Signed)
 I spoke with pt; about 4 wks ago pt was lifting something heavy and felt pop in spine. Pt said since then pt does not have pain at same time in back, hip and leg but on and off has pain separately in these areas. Pt said the worse pain level has been 6. Pt saisd sometimes hard to sleep due to pain but pt said he is making it.No swelling in leg, no redness or warmth. NO CP or SOB.Pt said he is working and 05/14/24 is first time he can schedule appt. Pt already has appt with Dr KANDICE on 05/14/24 at 9 AM. UC & ED precautions given and pt voiced understanding

## 2024-05-14 ENCOUNTER — Ambulatory Visit (INDEPENDENT_AMBULATORY_CARE_PROVIDER_SITE_OTHER): Admitting: Family Medicine

## 2024-05-14 ENCOUNTER — Encounter: Payer: Self-pay | Admitting: Family Medicine

## 2024-05-14 ENCOUNTER — Ambulatory Visit
Admission: RE | Admit: 2024-05-14 | Discharge: 2024-05-14 | Disposition: A | Discharge status: Home / Self Care | Source: Ambulatory Visit | Attending: Family Medicine | Admitting: Family Medicine

## 2024-05-14 VITALS — BP 122/82 | HR 95 | Temp 97.8°F | Ht 73.5 in | Wt 192.4 lb

## 2024-05-14 DIAGNOSIS — M2550 Pain in unspecified joint: Secondary | ICD-10-CM | POA: Diagnosis not present

## 2024-05-14 DIAGNOSIS — M25511 Pain in right shoulder: Secondary | ICD-10-CM | POA: Diagnosis not present

## 2024-05-14 DIAGNOSIS — M549 Dorsalgia, unspecified: Secondary | ICD-10-CM

## 2024-05-14 DIAGNOSIS — G8929 Other chronic pain: Secondary | ICD-10-CM | POA: Insufficient documentation

## 2024-05-14 DIAGNOSIS — M542 Cervicalgia: Secondary | ICD-10-CM | POA: Insufficient documentation

## 2024-05-14 DIAGNOSIS — M5489 Other dorsalgia: Secondary | ICD-10-CM | POA: Diagnosis not present

## 2024-05-14 LAB — CBC WITH DIFFERENTIAL/PLATELET
Basophils Absolute: 0 K/uL (ref 0.0–0.1)
Basophils Relative: 0.2 % (ref 0.0–3.0)
Eosinophils Absolute: 0.1 K/uL (ref 0.0–0.7)
Eosinophils Relative: 0.9 % (ref 0.0–5.0)
HCT: 40.1 % (ref 39.0–52.0)
Hemoglobin: 13.7 g/dL (ref 13.0–17.0)
Lymphocytes Relative: 13.1 % (ref 12.0–46.0)
Lymphs Abs: 1.4 K/uL (ref 0.7–4.0)
MCHC: 34.2 g/dL (ref 30.0–36.0)
MCV: 89.4 fl (ref 78.0–100.0)
Monocytes Absolute: 0.7 K/uL (ref 0.1–1.0)
Monocytes Relative: 6.2 % (ref 3.0–12.0)
Neutro Abs: 8.5 K/uL — ABNORMAL HIGH (ref 1.4–7.7)
Neutrophils Relative %: 79.6 % — ABNORMAL HIGH (ref 43.0–77.0)
Platelets: 337 K/uL (ref 150.0–400.0)
RBC: 4.49 Mil/uL (ref 4.22–5.81)
RDW: 12.9 % (ref 11.5–15.5)
WBC: 10.6 K/uL — ABNORMAL HIGH (ref 4.0–10.5)

## 2024-05-14 LAB — COMPREHENSIVE METABOLIC PANEL WITH GFR
ALT: 11 U/L (ref 0–53)
AST: 12 U/L (ref 0–37)
Albumin: 4.5 g/dL (ref 3.5–5.2)
Alkaline Phosphatase: 68 U/L (ref 39–117)
BUN: 8 mg/dL (ref 6–23)
CO2: 30 meq/L (ref 19–32)
Calcium: 9.2 mg/dL (ref 8.4–10.5)
Chloride: 97 meq/L (ref 96–112)
Creatinine, Ser: 0.93 mg/dL (ref 0.40–1.50)
GFR: 90.28 mL/min (ref >60.00)
Glucose, Bld: 114 mg/dL — ABNORMAL HIGH (ref 70–99)
Potassium: 4.5 meq/L (ref 3.5–5.1)
Sodium: 134 meq/L — ABNORMAL LOW (ref 135–145)
Total Bilirubin: 0.5 mg/dL (ref 0.2–1.2)
Total Protein: 7 g/dL (ref 6.0–8.3)

## 2024-05-14 LAB — C-REACTIVE PROTEIN: CRP: <1 mg/dL (ref 0.5–20.0)

## 2024-05-14 LAB — SEDIMENTATION RATE: Sed Rate: 10 mm/h (ref 0–20)

## 2024-05-14 MED ORDER — MELOXICAM 15 MG PO TABS: ORAL_TABLET | ORAL | 0 refills | Status: DC

## 2024-05-14 MED ORDER — METHOCARBAMOL 500 MG PO TABS: 500.0000 mg | ORAL_TABLET | Freq: Three times a day (TID) | ORAL | 0 refills | Status: DC | PRN

## 2024-05-14 MED ORDER — PREDNISONE 20 MG PO TABS
ORAL_TABLET | ORAL | 0 refills | Status: AC
Start: 2024-05-14 — End: ?

## 2024-05-14 NOTE — Assessment & Plan Note (Signed)
 Present for years, acutely worse over the past 2 months.  Update general spine films given multiple areas affected and chronicity of symptoms. Rx robaxin  muscle relaxant, Rx prednisone  taper for quicker relief, refill meloxicam  15mg  which was previously beneficial.

## 2024-05-14 NOTE — Assessment & Plan Note (Addendum)
 Multiple joints affected, describes some symptoms of inflammatory arthritis -will update ESR/CRP to eval for PM&R, as well as CBC today.  Not consistent with gout

## 2024-05-14 NOTE — Patient Instructions (Addendum)
 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 May use muscle relaxant as well.  Consider physical therapy and sports med eval for possible steroid injection.  If ineffective, we may send you to surgeon for further evaluation.  Let me know if not improving with this.

## 2024-05-14 NOTE — Assessment & Plan Note (Addendum)
 Present for years, acutely worse in the past 2 months.  Anticipate osteoarthritis related provided with exercises from [facility] pt advisor  Given chronicity, will update neck and spine films off-site as our xray machine is down.

## 2024-05-14 NOTE — Assessment & Plan Note (Addendum)
 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  Will order R shoulder xrays for further evaluation  Provided with exercises from [facility] pt advisor. Discussed sports med /injection vs PT as next steps - he will let me know if decides to proceed with either.

## 2024-05-14 NOTE — Progress Notes (Signed)
 Ph: [phone] Fax: [phone]   Patient ID: Duane Hudson, male    DOB: 1966/04/18, 58 y.o.   MRN: [id number]  This visit was conducted in person.  BP 122/82   Pulse 95   Temp 97.8 F (36.6 C) (Oral)   Ht 6' 1.5 (1.867 m)   Wt 192 lb 6 oz (87.3 kg)   SpO2 99%   BMI 25.04 kg/m    CC: back neck and shoulder pain  Subjective:   HPI: Duane Hudson is a 58 y.o. male presenting on 05/14/2024 for Back Pain (C/o lower back pain, R hip,B knees and ankle pain.  States burning sensation in R hip radiating down leg.Also C/o R shoulder paid. Sxs started ago  After exercising and hearing popping sounds in lower back.Pt tried Ibuprofen OTC, helpful 200mg )   2 mo h/o neck, shoulder, lower back pain Denies inciting trauma/injury or falls.  May have started after working out with 20 lb dumbbells - heard spine pop 3 times. Low back pain started after this.  Notes right lower back burning pain with prolonged walking with radiation down left buttock into posterior leg and lateral ankle.  No bowel/bladder accidents, saddle anesthesia, leg weakness.   Chronic neck pain for years - burning between shoulder blades. Now having neck pain from base of skull to mid thoracic back. [place] when arms are outstretched elevated unsupported. Ie using computer mouse unsupported, feels better when he is able to support arms. Arms feel weaker than previously. Some R shoulder pain to anterior/lateral shoulder, with radiation down upper anterior arm - feels tear to upper arm when he lifts shoulder past midline.   Notes some neck and shoulder and hip stiffness, worse in the mornings.  No redness swelling or warmth of joints.  No fevers/chills, new rashes, oral lesions, red eyes.  Denies significant fatigue but notes more difficulty with routine things ie rolling trash bin 200 ft to collection spot and back.   Treating pain with ibuprofen 400mg  TID with good benefit.   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  Most recently completed meloxicam  course through ortho.      Relevant past medical, surgical, family and social history reviewed and updated as indicated. Interim medical history since our last visit reviewed. Allergies and medications reviewed and updated. Outpatient Medications Prior to Visit  Medication Sig Dispense Refill   Multiple Vitamins-Minerals (CENTRUM MULTI + OMEGA 3) CHEW Chew 1 tablet by mouth daily.     polyethylene glycol powder (GAVILAX) 17 GM/SCOOP powder TAKE 8.5-17 G BY MOUTH DAILY AS NEEDED FOR MODERATE CONSTIPATION (HOLD FOR DIARRHEA). 510 g 1   Probiotic Product (DIGESTIVE ADVANTAGE GUMMIES PO) Take 4 gummy po qd     psyllium (METAMUCIL) 58.6 % packet Take 1 packet by mouth daily.     meloxicam  (MOBIC ) 15 MG tablet Take 15 mg by mouth daily.     No facility-administered medications prior to visit.     Per HPI unless specifically indicated in ROS section below Review of Systems  Objective:  BP 122/82   Pulse 95   Temp 97.8 F (36.6 C) (Oral)   Ht 6' 1.5 (1.867 m)   Wt 192 lb 6 oz (87.3 kg)   SpO2 99%   BMI 25.04 kg/m   Wt Readings from Last 3 Encounters:  05/14/24 192 lb 6 oz (87.3 kg)  08/11/23 181 lb (82.1 kg)  07/28/23 182 lb (82.6 kg)      Physical Exam Vitals and nursing note reviewed.  Constitutional:      Appearance: Normal appearance. He is not ill-appearing.  HENT:     Head: Normocephalic and atraumatic.     Mouth/Throat:     Mouth: Mucous membranes are moist.     Pharynx: Oropharynx is clear. No oropharyngeal exudate or posterior oropharyngeal erythema.  Eyes:     Extraocular Movements: Extraocular movements intact.     Conjunctiva/sclera: Conjunctivae normal.     Pupils: Pupils are equal, round, and reactive to light.  Neck:     Vascular: No carotid bruit.     Comments:  Limited ROM flexion/extension due to discomfort No significant midline  spine tenderness No paracervical mm tenderness Musculoskeletal:        General: Tenderness present.     Cervical back: Normal range of motion. No rigidity.     Right lower leg: No edema.     Left lower leg: No edema.     Comments:  No pain midline thoracic or lumbar spine No significant paraspinous mm tenderness Neg SLR bilaterally. No pain with int/ext rotation at hip. Neg FABER. No significant pain at SIJ, GTB or sciatic notch bilaterally.   Left shoulder WNL R shoulder exam: No deformity of shoulders on inspection. No pain with palpation of shoulder landmarks. Limited ROM in abduction > forward flexion past 90 degrees due to pain and stiffness, both active and passively. No pain or weakness with testing SITS in ext/int rotation. + pain with empty can sign. Neg Speed test. No significant impingement. Discomfort with rotation of humeral head in [facility] joint.   Lymphadenopathy:     Cervical: No cervical adenopathy.  Skin:    General: Skin is warm and dry.  Neurological:     Mental Status: He is alert.     Sensory: Sensation is intact.     Motor: Motor function is intact.     Coordination: Coordination is intact.     Gait: Gait is intact.     Comments:  Neg spurling bilaterally 5/5 strength BLE Diminished DTRs BLE       Results for orders placed or performed in visit on 05/14/24  Comprehensive metabolic panel with GFR   Collection Time: 05/14/24  9:55 AM  Result Value Ref Range   Sodium 134 (L) 135 - 145 mEq/L   Potassium 4.5 3.5 - 5.1 mEq/L   Chloride 97 96 - 112 mEq/L   CO2 30 19 - 32 mEq/L   Glucose, Bld 114 (H) 70 - 99 mg/dL   BUN 8 6 - 23 mg/dL   Creatinine, Ser 9.06 0.40 - 1.50 mg/dL   Total Bilirubin 0.5 0.2 - 1.2 mg/dL   Alkaline Phosphatase 68 39 - 117 U/L   AST 12 0 - 37 U/L   ALT 11 0 - 53 U/L   Total Protein 7.0 6.0 - 8.3 g/dL   Albumin 4.5 3.5 - 5.2 g/dL   GFR 09.71 >39.99 mL/min   Calcium 9.2 8.4 - 10.5 mg/dL  CBC with Differential/Platelet    Collection Time: 05/14/24  9:55 AM  Result Value Ref Range   WBC 10.6 (H) 4.0 - 10.5 K/uL   RBC 4.49 4.22 - 5.81 Mil/uL   Hemoglobin 13.7 13.0 - 17.0 g/dL   HCT 59.8 60.9 - 47.9 %   MCV 89.4 78.0 - 100.0 fl   MCHC 34.2 30.0 - 36.0 g/dL   RDW 87.0 88.4 - 84.4 %  Platelets 337.0 150.0 - 400.0 K/uL   Neutrophils Relative % 79.6 (H) 43.0 - 77.0 %   Lymphocytes Relative 13.1 12.0 - 46.0 %   Monocytes Relative 6.2 3.0 - 12.0 %   Eosinophils Relative 0.9 0.0 - 5.0 %   Basophils Relative 0.2 0.0 - 3.0 %   Neutro Abs 8.5 (H) 1.4 - 7.7 K/uL   Lymphs Abs 1.4 0.7 - 4.0 K/uL   Monocytes Absolute 0.7 0.1 - 1.0 K/uL   Eosinophils Absolute 0.1 0.0 - 0.7 K/uL   Basophils Absolute 0.0 0.0 - 0.1 K/uL  Sedimentation rate   Collection Time: 05/14/24  9:55 AM  Result Value Ref Range   Sed Rate 10 0 - 20 mm/hr  C-reactive protein   Collection Time: 05/14/24  9:55 AM  Result Value Ref Range   CRP <1.0 0.5 - 20.0 mg/dL    Assessment & Plan:   Problem List Items Addressed This Visit     Polyarthralgia - Primary   Multiple joints affected, describes some symptoms of inflammatory arthritis -will update ESR/CRP to eval for PM&R, as well as CBC today.  Not consistent with gout      Relevant Orders   Comprehensive metabolic panel with GFR (Completed)   CBC with Differential/Platelet (Completed)   Sedimentation rate (Completed)   C-reactive protein (Completed)   DG Shoulder Right   DG Cervical Spine Complete   DG Thoracic Spine W/Swimmers   DG Lumbar Spine Complete   Midline back pain   Present for years, acutely worse over the past 2 months.  Update general spine films given multiple areas affected and chronicity of symptoms. Rx robaxin  muscle relaxant, Rx prednisone  taper for quicker relief, refill meloxicam  15mg  which was previously beneficial.       Relevant Medications   predniSONE  (DELTASONE ) 20 MG tablet   meloxicam  (MOBIC ) 15 MG tablet   methocarbamol  (ROBAXIN ) 500 MG tablet   Other  Relevant Orders   DG Cervical Spine Complete   DG Thoracic Spine W/Swimmers   DG Lumbar Spine Complete   Chronic right shoulder pain   Ongoing pain for 2+ months , feels similar to prior frozen shoulder injury  Exam today suspicious for RTC injury and developing frozen shoulder.  Will order R shoulder xrays for further evaluation  Provided with exercises from [facility] pt advisor. Discussed sports med /injection vs PT as next steps - he will let me know if decides to proceed with either.       Relevant Medications   predniSONE  (DELTASONE ) 20 MG tablet   meloxicam  (MOBIC ) 15 MG tablet   methocarbamol  (ROBAXIN ) 500 MG tablet   Other Relevant Orders   DG Shoulder Right   Chronic neck and back pain   Present for years, acutely worse in the past 2 months.  Anticipate osteoarthritis related provided with exercises from [facility] pt advisor  Given chronicity, will update neck and spine films off-site as our xray machine is down.       Relevant Medications   predniSONE  (DELTASONE ) 20 MG tablet   meloxicam  (MOBIC ) 15 MG tablet   methocarbamol  (ROBAXIN ) 500 MG tablet   Other Relevant Orders   DG Cervical Spine Complete     Meds ordered this encounter  Medications   predniSONE  (DELTASONE ) 20 MG tablet    Sig: Take two tablets daily for 3 days followed by one tablet daily for 4 days    Dispense:  10 tablet    Refill:  0   meloxicam  (MOBIC ) 15  MG tablet    Sig: Take one oral daily for 1 wk with food then as needed pain    Dispense:  30 tablet    Refill:  0   methocarbamol  (ROBAXIN ) 500 MG tablet    Sig: Take 1 tablet (500 mg total) by mouth every 8 (eight) hours as needed for muscle spasms.    Dispense:  30 tablet    Refill:  0    Orders Placed This Encounter  Procedures   DG Shoulder Right    Standing Status:   Future    Number of Occurrences:   1    Expiration Date:   05/14/2025    Reason for Exam (SYMPTOM  OR DIAGNOSIS REQUIRED):   developing frozen shoulder, pain x2 months    Preferred  imaging location?:   [facility]   DG Cervical Spine Complete    Standing Status:   Future    Number of Occurrences:   1    Expiration Date:   05/14/2025    Reason for Exam (SYMPTOM  OR DIAGNOSIS REQUIRED):   chronic neck pain    Preferred imaging location?:   [facility]   DG Thoracic Spine W/Swimmers    Standing Status:   Future    Number of Occurrences:   1    Expiration Date:   05/14/2025    Reason for Exam (SYMPTOM  OR DIAGNOSIS REQUIRED):   thoracic spine chronic pain    Preferred imaging location?:   [facility]   DG Lumbar Spine Complete    Standing Status:   Future    Number of Occurrences:   1    Expiration Date:   05/14/2025    Reason for Exam (SYMPTOM  OR DIAGNOSIS REQUIRED):   chronic right low bck pain with R radiculopathy    Preferred imaging location?:   [facility]   Comprehensive metabolic panel with GFR   CBC with Differential/Platelet   Sedimentation rate   C-reactive protein    Patient Instructions  Labs today  I'd like to check xrays of spine neck and right shoulder  Anticipate wear and tear arthritis changes to spine as well as developing frozen shoulder on right with rotator cuff injury - take prednisone  taper sent to pharmacy then restart meloxicam  15mg  daily. May use muscle relaxant as well.  Consider physical therapy and sports med eval for possible steroid injection.  If ineffective, we may send you to surgeon for further evaluation.  Let me know if not improving with this.   Follow up plan: Return if symptoms worsen or fail to improve.  Anton Blas, MD

## 2024-05-18 ENCOUNTER — Ambulatory Visit: Payer: Self-pay | Admitting: Family Medicine

## 2024-05-18 DIAGNOSIS — M2550 Pain in unspecified joint: Secondary | ICD-10-CM

## 2024-06-10 ENCOUNTER — Other Ambulatory Visit: Payer: Self-pay | Admitting: Family Medicine

## 2024-06-10 DIAGNOSIS — M5489 Other dorsalgia: Secondary | ICD-10-CM

## 2024-06-10 NOTE — Telephone Encounter (Signed)
 Meloxicam  Last filled:  05/14/24, #30 Last OV:  05/14/24, back pain Next OV:  08/16/24, CPE

## 2024-06-10 NOTE — Telephone Encounter (Signed)
 Left message to call office to see if refill is needed or requested by pharmacy.

## 2024-06-13 ENCOUNTER — Other Ambulatory Visit: Payer: Self-pay | Admitting: Family Medicine

## 2024-06-13 DIAGNOSIS — M5489 Other dorsalgia: Secondary | ICD-10-CM

## 2024-06-14 NOTE — Telephone Encounter (Signed)
 Robaxin  Last filled:  05/24/24, #30 Last OV:  05/14/24, back pain Next OV:  08/16/24, CPE

## 2024-07-08 ENCOUNTER — Other Ambulatory Visit: Payer: Self-pay | Admitting: Family Medicine

## 2024-07-08 DIAGNOSIS — M5489 Other dorsalgia: Secondary | ICD-10-CM

## 2024-07-08 NOTE — Telephone Encounter (Signed)
 Robaxin  Last filled:  06/11/24, #30 Last OV:  05/14/24, back pain Next OV:  08/16/24, CPE

## 2024-08-04 ENCOUNTER — Other Ambulatory Visit: Payer: Self-pay | Admitting: Family Medicine

## 2024-08-04 DIAGNOSIS — R739 Hyperglycemia, unspecified: Secondary | ICD-10-CM

## 2024-08-04 DIAGNOSIS — E785 Hyperlipidemia, unspecified: Secondary | ICD-10-CM

## 2024-08-04 DIAGNOSIS — Z125 Encounter for screening for malignant neoplasm of prostate: Secondary | ICD-10-CM

## 2024-08-06 ENCOUNTER — Other Ambulatory Visit: Payer: Self-pay | Admitting: Family Medicine

## 2024-08-06 DIAGNOSIS — M5489 Other dorsalgia: Secondary | ICD-10-CM

## 2024-08-06 NOTE — Telephone Encounter (Signed)
 Meloxicam  Last filled:  07/10/24, #30 Last OV:  05/14/24, back pain Next OV:  08/16/24, CPE

## 2024-08-09 ENCOUNTER — Other Ambulatory Visit (INDEPENDENT_AMBULATORY_CARE_PROVIDER_SITE_OTHER): Payer: Commercial Managed Care - PPO

## 2024-08-09 DIAGNOSIS — Z125 Encounter for screening for malignant neoplasm of prostate: Secondary | ICD-10-CM

## 2024-08-09 DIAGNOSIS — R739 Hyperglycemia, unspecified: Secondary | ICD-10-CM | POA: Diagnosis not present

## 2024-08-09 DIAGNOSIS — E785 Hyperlipidemia, unspecified: Secondary | ICD-10-CM

## 2024-08-09 LAB — LIPID PANEL
Cholesterol: 152 mg/dL (ref 0–200)
HDL: 41 mg/dL (ref >39.00)
LDL Cholesterol: 96 mg/dL (ref 0–99)
NonHDL: 110.51
Total CHOL/HDL Ratio: 4
Triglycerides: 73 mg/dL (ref 0.0–149.0)
VLDL: 14.6 mg/dL (ref 0.0–40.0)

## 2024-08-09 LAB — PSA: PSA: 3.06 ng/mL (ref 0.10–4.00)

## 2024-08-09 LAB — COMPREHENSIVE METABOLIC PANEL WITH GFR
ALT: 17 U/L (ref 0–53)
AST: 14 U/L (ref 0–37)
Albumin: 4.3 g/dL (ref 3.5–5.2)
Alkaline Phosphatase: 56 U/L (ref 39–117)
BUN: 7 mg/dL (ref 6–23)
CO2: 27 meq/L (ref 19–32)
Calcium: 8.9 mg/dL (ref 8.4–10.5)
Chloride: 97 meq/L (ref 96–112)
Creatinine, Ser: 1 mg/dL (ref 0.40–1.50)
GFR: 82.61 mL/min (ref >60.00)
Glucose, Bld: 106 mg/dL — ABNORMAL HIGH (ref 70–99)
Potassium: 4.4 meq/L (ref 3.5–5.1)
Sodium: 133 meq/L — ABNORMAL LOW (ref 135–145)
Total Bilirubin: 0.7 mg/dL (ref 0.2–1.2)
Total Protein: 6.6 g/dL (ref 6.0–8.3)

## 2024-08-09 LAB — HEMOGLOBIN A1C: Hgb A1c MFr Bld: 5.8 % (ref 4.6–6.5)

## 2024-08-10 ENCOUNTER — Ambulatory Visit: Payer: Self-pay | Admitting: Family Medicine

## 2024-08-16 ENCOUNTER — Ambulatory Visit: Payer: Self-pay | Admitting: Family Medicine

## 2024-08-16 ENCOUNTER — Ambulatory Visit (INDEPENDENT_AMBULATORY_CARE_PROVIDER_SITE_OTHER): Payer: Commercial Managed Care - PPO | Admitting: Family Medicine

## 2024-08-16 ENCOUNTER — Encounter: Payer: Self-pay | Admitting: Family Medicine

## 2024-08-16 VITALS — BP 130/78 | HR 96 | Temp 98.5°F | Ht 73.0 in | Wt 188.2 lb

## 2024-08-16 DIAGNOSIS — G8929 Other chronic pain: Secondary | ICD-10-CM

## 2024-08-16 DIAGNOSIS — Z0000 Encounter for general adult medical examination without abnormal findings: Secondary | ICD-10-CM

## 2024-08-16 DIAGNOSIS — R972 Elevated prostate specific antigen [PSA]: Secondary | ICD-10-CM | POA: Insufficient documentation

## 2024-08-16 DIAGNOSIS — M2550 Pain in unspecified joint: Secondary | ICD-10-CM

## 2024-08-16 DIAGNOSIS — R7303 Prediabetes: Secondary | ICD-10-CM | POA: Diagnosis not present

## 2024-08-16 DIAGNOSIS — E785 Hyperlipidemia, unspecified: Secondary | ICD-10-CM

## 2024-08-16 DIAGNOSIS — M25511 Pain in right shoulder: Secondary | ICD-10-CM

## 2024-08-16 DIAGNOSIS — Z8052 Family history of malignant neoplasm of bladder: Secondary | ICD-10-CM

## 2024-08-16 LAB — POC URINALSYSI DIPSTICK (AUTOMATED)
Bilirubin, UA: NEGATIVE
Glucose, UA: NEGATIVE
Ketones, UA: NEGATIVE
Leukocytes, UA: NEGATIVE
Nitrite, UA: NEGATIVE
Protein, UA: NEGATIVE
Spec Grav, UA: <=1.005 — AB (ref 1.010–1.025)
Urobilinogen, UA: 0.2 U/dL
pH, UA: 6.5 (ref 5.0–8.0)

## 2024-08-16 NOTE — Assessment & Plan Note (Signed)
 Preventative protocols reviewed and updated unless pt declined. Discussed healthy diet and lifestyle.

## 2024-08-16 NOTE — Patient Instructions (Addendum)
 Urinalysis today  Consider flu and tetanus shots.  Let me know if ongoing or worsening joint pains for repeat labs.  Consider heart CT scan with calcium score - let me know if interested.  Schedule appointment with Dr Watt sports medicine for R shoulder pain - possibly developed frozen shoulder on that side. Exercises provided today. Ok to continue meloxicam .  Return in 4-6 months for lab visit only to repeat PSA levels.

## 2024-08-16 NOTE — Progress Notes (Signed)
 Ph: [phone] Fax: [phone]   Patient ID: Duane Hudson, male    DOB: [birth year], 58 y.o.   MRN: [id number]  This visit was conducted in person.  BP 130/78   Pulse 96   Temp 98.5 F (36.9 C) (Oral)   Ht 6' 1 (1.854 m)   Wt 188 lb 4 oz (85.4 kg)   SpO2 96%   BMI 24.84 kg/m    CC: CPE Subjective:   HPI: Duane Hudson is a 58 y.o. male presenting on 08/16/2024 for Annual Exam   Ongoing R shoulder (x38yr) and bilat knee and hip pains.  Steroid course completely resolved arthralgias - back is feeling better after steroid injection but 2d after steroid course R shoulder, bilat hips and knee pain recurred. No fatigue, weakness, headaches, fever, rash.  Continues taking meloxicam  15mg  daily. Rare PRN methocarbamol .  He doesn't think he would be able to pass physical assessment at work due to this.   3 wks ago had stomach flu.   PSA trending up 0.88 --> 3.06 over the past year. Previously 1.07 (08/2017).  No fmhx prostate cancer.  No significant prostate symptoms.   Father with h/o bladder and kidney cancer.    Preventative: COLONOSCOPY 07/2023 - SSP x2, int hem, rpt 3 yrs (Pyrtle)  Prostate cancer - yearly PSA. Nocturia x1-2.  Lung cancer screen - not eligible  Flu shot declines COVID shot - did not receive  Tetanus shot - unsure. Declines.  Pneumococcal - declines Shingrix - declines Seat belt use discussed Sunscreen use discussed. No changing moles on skin.  Sleep - averaging 8 hours/night Ex smoker, quit 2007, 15 PY hx.  Alcohol - none Dentist - doesn't see, does have dental insurance. 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 Interim medical history since our last visit reviewed. Allergies and medications reviewed and updated. Outpatient Medications Prior to Visit  Medication Sig Dispense Refill   meloxicam  (MOBIC ) 15 MG tablet TAKE ONE DAILY FOR 1 WEEK WITH FOOD THEN AS NEEDED PAIN 30 tablet 0   methocarbamol  (ROBAXIN ) 500 MG tablet TAKE 1 TABLET BY MOUTH EVERY 8 HOURS AS NEEDED FOR MUSCLE SPASMS. 30 tablet 0   Multiple Vitamins-Minerals (CENTRUM MULTI + OMEGA 3) CHEW Chew 1 tablet by mouth daily.     polyethylene glycol powder (GAVILAX) 17 GM/SCOOP powder TAKE 8.5-17 G BY MOUTH DAILY AS NEEDED FOR MODERATE CONSTIPATION (HOLD FOR DIARRHEA). 510 g 1   Probiotic Product (DIGESTIVE ADVANTAGE GUMMIES PO) Take 4 gummy po qd     psyllium (METAMUCIL) 58.6 % packet Take 1 packet by mouth daily.     predniSONE  (DELTASONE ) 20 MG tablet Take two tablets daily for 3 days followed by one tablet daily for 4 days 10 tablet 0   No facility-administered medications prior to visit.     Per HPI unless specifically indicated in ROS section below Review of Systems  Constitutional:  Negative for activity change, appetite change, chills, fatigue, fever and unexpected weight change.  HENT:  Negative for hearing loss.   Eyes:  Negative for visual disturbance.  Respiratory:  Negative for cough, chest tightness,  shortness of breath and wheezing.   Cardiovascular:  Negative for chest pain, palpitations and leg swelling.  Gastrointestinal:  Negative for abdominal distention, abdominal pain, blood in stool, constipation, diarrhea, nausea and vomiting.  Genitourinary:  Negative for difficulty urinating and hematuria.  Musculoskeletal:  Negative for arthralgias, myalgias and neck pain.  Skin:  Negative for rash.  Neurological:  Negative for dizziness, seizures, syncope and headaches.  Hematological:  Negative for adenopathy. Does not bruise/bleed easily.  Psychiatric/Behavioral:   Negative for dysphoric mood. The patient is not nervous/anxious.     Objective:  BP 130/78   Pulse 96   Temp 98.5 F (36.9 C) (Oral)   Ht 6' 1 (1.854 m)   Wt 188 lb 4 oz (85.4 kg)   SpO2 96%   BMI 24.84 kg/m   Wt Readings from Last 3 Encounters:  08/16/24 188 lb 4 oz (85.4 kg)  05/14/24 192 lb 6 oz (87.3 kg)  08/11/23 181 lb (82.1 kg)      Physical Exam Vitals and nursing note reviewed.  Constitutional:      General: He is not in acute distress.    Appearance: Normal appearance. He is well-developed. He is not ill-appearing.  HENT:     Head: Normocephalic and atraumatic.     Right Ear: Hearing, tympanic membrane, ear canal and external ear normal.     Left Ear: Hearing, tympanic membrane, ear canal and external ear normal.     Mouth/Throat:     Mouth: Mucous membranes are moist.     Pharynx: Oropharynx is clear. No oropharyngeal exudate or posterior oropharyngeal erythema.  Eyes:     General: No scleral icterus.    Extraocular Movements: Extraocular movements intact.     Conjunctiva/sclera: Conjunctivae normal.     Pupils: Pupils are equal, round, and reactive to light.  Neck:     Thyroid : No thyroid  mass or thyromegaly.  Cardiovascular:     Rate and Rhythm: Normal rate and regular rhythm.     Pulses: Normal pulses.          Radial pulses are 2+ on the right side and 2+ on the left side.     Heart sounds: Normal heart sounds. No murmur heard. Pulmonary:     Effort: Pulmonary effort is normal. No respiratory distress.     Breath sounds: Normal breath sounds. No wheezing, rhonchi or rales.  Abdominal:     General: Bowel sounds are normal. There is no distension.     Palpations: Abdomen is soft. There is no mass.     Tenderness: There is no abdominal tenderness. There is no guarding or rebound.     Hernia: No hernia is present.  Genitourinary:    Prostate: Enlarged (marked). Not tender and no nodules present.     Rectum: Normal. No mass, tenderness, anal fissure or  internal hemorrhoid. Normal anal tone.     Comments:  DRE with moderate to severe prostatic enlargement without tenderness nodularity or induration Musculoskeletal:        General: Normal range of motion.     Cervical back: Normal range of motion and neck supple.     Right lower leg: No edema.     Left lower leg: No edema.     Comments:  L shoulder WNL R Shoulder exam: No deformity of shoulders on inspection. No pain with palpation of shoulder landmarks. FROM in abduction and forward flexion. No pain or weakness with testing SITS in ext/int rotation. No pain with empty can  sign. Neg Yerguson, Speed test. No impingement. No pain with crossover test. No pain with rotation of humeral head in GH joint.   Lymphadenopathy:     Cervical: No cervical adenopathy.  Skin:    General: Skin is warm and dry.     Findings: No rash.  Neurological:     General: No focal deficit present.     Mental Status: He is alert and oriented to person, place, and time.  Psychiatric:        Mood and Affect: Mood normal.        Behavior: Behavior normal.        Thought Content: Thought content normal.        Judgment: Judgment normal.       Results for orders placed or performed in visit on 08/16/24  POCT Urinalysis Dipstick (Automated)   Collection Time: 08/16/24  9:27 AM  Result Value Ref Range   Color, UA yellow    Clarity, UA clear    Glucose, UA Negative Negative   Bilirubin, UA negative    Ketones, UA negatie    Spec Grav, UA <=1.005 (A) 1.010 - 1.025   Blood, UA +-    pH, UA 6.5 5.0 - 8.0   Protein, UA Negative Negative   Urobilinogen, UA 0.2 0.2 or 1.0 E.U./dL   Nitrite, UA negative    Leukocytes, UA Negative Negative  Micro: WBC 0 RBC 0  Bact tr Casts none Epi rare UCx not sent   Lab Results  Component Value Date   PSA 3.06 08/09/2024   PSA 0.88 04/21/2023   PSA 1.07 09/06/2017     Lab Results  Component Value Date   CHOL 152 08/09/2024   HDL 41.00 08/09/2024    LDLCALC 96 08/09/2024   TRIG 73.0 08/09/2024   CHOLHDL 4 08/09/2024    Lab Results  Component Value Date   NA 133 (L) 08/09/2024   CL 97 08/09/2024   K 4.4 08/09/2024   CO2 27 08/09/2024   BUN 7 08/09/2024   CREATININE 1.00 08/09/2024   GFR 82.61 08/09/2024   CALCIUM 8.9 08/09/2024   PHOS 3.1 09/10/2018   ALBUMIN 4.3 08/09/2024   GLUCOSE 106 (H) 08/09/2024    Lab Results  Component Value Date   ALT 17 08/09/2024   AST 14 08/09/2024   ALKPHOS 56 08/09/2024   BILITOT 0.7 08/09/2024    Lab Results  Component Value Date   HGBA1C 5.8 08/09/2024    Assessment & Plan:   Problem List Items Addressed This Visit     Health maintenance examination - Primary (Chronic)   Preventative protocols reviewed and updated unless pt declined. Discussed healthy diet and lifestyle.       Prediabetes   Reviewed limiting added sugar in diet.       Dyslipidemia   Chronic, mild off medication. Noted fmhx CAD (brother stents age 51s, father CVA age 36).  Offered cardiac CT with CCS - he will consider and let me know.  Consider Lp(a) next labs.  The 10-year ASCVD risk score (Arnett DK, et al., 2019) is: 7.3%   Values used to calculate the score:     Age: 63 years     Clincally relevant sex: Male     Is Non-Hispanic African American: No     Diabetic: No     Tobacco smoker: No     Systolic Blood Pressure: 130 mmHg     Is BP treated: No     HDL  Cholesterol: 41 mg/dL     Total Cholesterol: 152 mg/dL       Family history of bladder cancer   Update urinalysis       Relevant Orders   POCT Urinalysis Dipstick (Automated) (Completed)   Polyarthralgia   Back pains overall improved after steroid course, with return of hip and knee discomfort anticipate OA related. Ok to continue 15mg  daily.       Chronic right shoulder pain   Ongoing for about a year.  Exam suspicious for frozen shoulder.  Provided with frozen shoulder exercises Rec f/u with Dr Watt sports medicine       Increased prostate specific antigen (PSA) velocity   Discussed. DRE reassuring, with evidence of BPH. Notes ongoing nocturia x2 but no daytime symptoms. Rec RTC 4-6 months for rpt PSA to trend more closely.  No fmhx prostate cancer.       Relevant Orders   PSA, Total with Reflex to PSA, Free     No orders of the defined types were placed in this encounter.   Orders Placed This Encounter  Procedures   PSA, Total with Reflex to PSA, Free    Standing Status:   Future    Expiration Date:   08/16/2025   POCT Urinalysis Dipstick (Automated)    Patient Instructions  Urinalysis today  Consider flu and tetanus shots.  Let me know if ongoing or worsening joint pains for repeat labs.  Consider heart CT scan with calcium score - let me know if interested.  Schedule appointment with Dr Watt sports medicine for R shoulder pain - possibly developed frozen shoulder on that side. Exercises provided today. Ok to continue meloxicam .  Return in 4-6 months for lab visit only to repeat PSA levels.   Follow up plan: Return in about 1 year (around 08/16/2025) for annual exam, prior fasting for blood work.  Anton Blas, MD

## 2024-08-16 NOTE — Assessment & Plan Note (Signed)
 Discussed. DRE reassuring, with evidence of BPH. Notes ongoing nocturia x2 but no daytime symptoms. Rec RTC 4-6 months for rpt PSA to trend more closely.  No fmhx prostate cancer.

## 2024-08-16 NOTE — Assessment & Plan Note (Signed)
 Reviewed limiting added sugar in diet.

## 2024-08-16 NOTE — Assessment & Plan Note (Signed)
 Back pains overall improved after steroid course, with return of hip and knee discomfort anticipate OA related. Ok to continue 15mg  daily.

## 2024-08-16 NOTE — Assessment & Plan Note (Signed)
 Chronic, mild off medication. Noted fmhx CAD (brother stents age 47s, father CVA age 59).  Offered cardiac CT with [facility] - he will consider and let me know.  Consider Lp(a) next labs.  The 10-year ASCVD risk score (Arnett DK, et al., 2019) is: 7.3%   Values used to calculate the score:     Age: 76 years     Clincally relevant sex: Male     Is Non-Hispanic African American: No     Diabetic: No     Tobacco smoker: No     Systolic Blood Pressure: 130 mmHg     Is BP treated: No     HDL Cholesterol: 41 mg/dL     Total Cholesterol: 152 mg/dL

## 2024-08-16 NOTE — Assessment & Plan Note (Signed)
 Update urinalysis

## 2024-08-16 NOTE — Assessment & Plan Note (Signed)
 Ongoing for about a year.  Exam suspicious for frozen shoulder.  Provided with frozen shoulder exercises Rec f/u with Dr Watt sports medicine

## 2024-08-20 NOTE — Progress Notes (Signed)
 "    [name] [name], MD, CAQ Sports Medicine [facility] [address] [place], [address]  Phone: [phone]  FAX: [phone]  [name] - 58 y.o. male  MRN [id number]  Date of Birth: [birth year]  Date: 08/21/2024  PCP: Rilla Baller, MD  Referral: Rilla Baller, MD  Chief Complaint  Patient presents with   Shoulder Pain    Right   Subjective:   Duane Hudson is a 58 y.o. very pleasant male patient with Body mass index is 25.99 kg/m. who presents with the following:  Discussed the use of AI scribe software for clinical note transcription with the patient, who gave verbal consent to proceed.  This is a pleasant 58 year old patient of Dr. Rilla, who presents today for ongoing evaluation of shoulder pain.  Radiographs from May 14, 2024, are reviewed and the patient has preserved glenohumeral joint space.  No significant arthropathy, other than mild acromioclavicular arthritis.  Actually saw my partner Dr. Rilla in July with diffuse polyarthropathy, and he had cervical, thoracic, lumbar, and shoulder x-rays at that time.  He also had a CRP and sed rate, and those were normal. History of Present Illness Duane Hudson is a 58 year old male who presents with right shoulder pain and limited range of motion.  He has been experiencing pain in his right shoulder when attempting to lift it, describing it as 'tight trying to go up any higher.' He is unable to lift it straight overhead. The symptoms have been present since January, approximately nine months ago.  He has tried using ice and heat for relief and takes meloxicam  daily. Despite the initial injury, he was able to continue with activities such as push-ups, but the pain has progressively worsened. He stopped working out for a period to rest the shoulder, but this did not lead to improvement. He has previously received steroids for knee and hip pain, which resolved  those issues but did not alleviate the shoulder pain.  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 Dozier.  It looks as if he had an arthroscopic capsular release.  He recalls undergoing therapy and exercises to regain motion in the left shoulder, which he continues to do at home.  He reports difficulty with certain movements, such as lifting the arm straight up or out to the side. He can perform curls but struggles with overhead presses and tricep extensions due to pain.  He has undergone x-rays, which showed mild joint changes in the acromioclavicular joint. He is right-handed and has been managing the condition with his current regimen but notes that the pain impacts his ability to perform certain tasks at work and in the gym.    Review of Systems is noted in the HPI, as appropriate  Objective:   BP 136/62   Pulse 89   Temp 98.2 F (36.8 C) (Temporal)   Ht 6' 1 (1.854 m)   Wt 197 lb (89.4 kg)   SpO2 98%   BMI 25.99 kg/m   GEN: No acute distress; alert,appropriate. PULM: Breathing comfortably in no respiratory distress PSYCH: Normally interactive.    Shoulder: R and L Inspection: No muscle wasting or winging Ecchymosis/edema: neg  AC joint, scapula, clavicle: NT Cervical spine: NT, full ROM Spurling's: neg ABNORMAL SIDE TESTED: Right UNLESS OTHERWISE NOTED, THE CONTRALATERAL SIDE HAS FULL RANGE OF MOTION. Abduction: 5/5, LIMITED TO 120 DEGREES Flexion: 5/5,  LIMITED TO 120 DEGNO ROM  IR, lift-off: 5/5. TESTED AT 90 DEGREES OF ABDUCTION, LIMITED TO 0 DEGREES ER at neutral:  5/5, TESTED AT 90 DEGREES OF ABDUCTION, LIMITED TO 15 DEGREES AC crossover and compression: PAIN Drop Test: neg Empty Can: neg Supraspinatus insertion: NT Bicipital groove: NT ALL OTHER SPECIAL TESTING EQUIVOCAL GIVEN LOSS OF MOTION C5-T1 intact Sensation intact Grip 5/5    Laboratory and Imaging Data:  Assessment and Plan:      ICD-10-CM   1. Adhesive capsulitis of right shoulder  M75.01     2. 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 Likely due to fibrotic thickening of the ligament capsule. Conservative management preferred, with most cases resolving without surgery. - Administer shoulder injection with lidocaine and Kenalog  for pain relief and expedited recovery. - Initiate daily range of motion exercises. - Re-evaluate shoulder in two months.  Intraarticular Shoulder Aspiration/Injection Procedure Note Duane Hudson [birth year] Date of procedure: 08/21/2024  Procedure: Large Joint Aspiration / Injection of Shoulder, Intraarticular, R Indications: Pain  Procedure Details Verbal consent was obtained from the patient. Risks explained and contrasted with benefits and alternatives. Patient prepped with Chloraprep and Ethyl Chloride used for anesthesia. An intraarticular shoulder injection was performed using the posterior approach; needle placed into joint capsule without difficulty. The patient tolerated the procedure well and had decreased pain post injection. No complications. Injection: 9 cc of Lidocaine 1% and 1 mL Kenalog  40 mg. Needle: 21 gauge, 2 inch   Medication Management during today's office visit: Meds ordered this encounter  Medications   triamcinolone  acetonide (KENALOG -40) injection 40 mg   There are no discontinued medications.  Orders placed today for conditions managed today: No orders of the defined types were placed in this encounter.   Disposition: Return in about 2 months (around 10/21/2024) for Dr. Watt, R frozen shoulder.  [employer] speech-to-text software was used for transcription in this dictation.  Possible  transcriptional errors can occur using Animal nutritionist.   Signed,  Jacques DASEN. Ellinore Merced, MD   Outpatient Encounter Medications as of 08/21/2024  Medication Sig   meloxicam  (MOBIC ) 15 MG tablet TAKE ONE DAILY FOR 1 WEEK WITH FOOD THEN AS NEEDED PAIN   methocarbamol  (ROBAXIN ) 500 MG tablet TAKE 1 TABLET BY MOUTH EVERY 8 HOURS AS NEEDED FOR MUSCLE SPASMS.   Multiple Vitamins-Minerals (CENTRUM MULTI + OMEGA 3) CHEW Chew 1 tablet by mouth daily.   polyethylene glycol powder (GAVILAX) 17 GM/SCOOP powder TAKE 8.5-17 G BY MOUTH DAILY AS NEEDED FOR MODERATE CONSTIPATION (HOLD FOR DIARRHEA).   Probiotic Product (DIGESTIVE ADVANTAGE GUMMIES PO) Take 4 gummy po qd   psyllium (METAMUCIL) 58.6 % packet Take 1 packet by mouth daily.   [EXPIRED] triamcinolone  acetonide (KENALOG -40) injection 40 mg    No facility-administered encounter medications on file as of 08/21/2024.   "

## 2024-08-21 ENCOUNTER — Ambulatory Visit (INDEPENDENT_AMBULATORY_CARE_PROVIDER_SITE_OTHER): Admitting: Family Medicine

## 2024-08-21 ENCOUNTER — Encounter: Payer: Self-pay | Admitting: Family Medicine

## 2024-08-21 VITALS — BP 136/62 | HR 89 | Temp 98.2°F | Ht 73.0 in | Wt 197.0 lb

## 2024-08-21 DIAGNOSIS — M25511 Pain in right shoulder: Secondary | ICD-10-CM

## 2024-08-21 DIAGNOSIS — G8929 Other chronic pain: Secondary | ICD-10-CM | POA: Diagnosis not present

## 2024-08-21 DIAGNOSIS — M7501 Adhesive capsulitis of right shoulder: Secondary | ICD-10-CM | POA: Diagnosis not present

## 2024-08-21 MED ORDER — TRIAMCINOLONE ACETONIDE 40 MG/ML IJ SUSP
40.0000 mg | Freq: Once | INTRAMUSCULAR | Status: AC
  Administered 2024-08-21: 40 mg via INTRA_ARTICULAR

## 2024-08-22 ENCOUNTER — Encounter: Payer: Self-pay | Admitting: Family Medicine

## 2024-09-04 ENCOUNTER — Other Ambulatory Visit: Payer: Self-pay | Admitting: Family Medicine

## 2024-09-04 DIAGNOSIS — M5489 Other dorsalgia: Secondary | ICD-10-CM

## 2024-09-06 ENCOUNTER — Other Ambulatory Visit: Payer: Self-pay

## 2024-09-06 DIAGNOSIS — M5489 Other dorsalgia: Secondary | ICD-10-CM

## 2024-09-06 MED ORDER — MELOXICAM 15 MG PO TABS: ORAL_TABLET | ORAL | 0 refills | Status: DC

## 2024-09-06 NOTE — Telephone Encounter (Signed)
 Copied from CRM #[id number]. Topic: Clinical - Prescription Issue >> Sep 06, 2024 11:18 AM [name] wrote: Reason for CRM: Patient's wife Duane Hudson, states that they  received a call about the meloxicam  (MOBIC ) 15 MG tablets from the nurse, wanting to make sure this is actually helping him. They told the nurse that it was, but states that the pharmacy has requested it and has not heard back about the request. She wants to be sure there is no issue with the prescription and is requesting this to be refilled, please. He is not yet out of it, but wants to also see if he could do a 90-day supply as well if possible.  [id number] [place], [place] - [address] [place] [address] Phone: [phone] Fax: [phone] Hours: Not open 24 hours  Please call wife Duane Hudson at [phone] with any updates

## 2024-09-06 NOTE — Telephone Encounter (Signed)
 ERx

## 2024-10-05 ENCOUNTER — Other Ambulatory Visit: Payer: Self-pay | Admitting: Family Medicine

## 2024-10-05 DIAGNOSIS — M5489 Other dorsalgia: Secondary | ICD-10-CM

## 2024-10-21 ENCOUNTER — Ambulatory Visit: Admitting: Family Medicine

## 2024-11-07 ENCOUNTER — Other Ambulatory Visit: Payer: Self-pay | Admitting: Family Medicine

## 2024-11-07 DIAGNOSIS — M5489 Other dorsalgia: Secondary | ICD-10-CM

## 2024-12-17 ENCOUNTER — Other Ambulatory Visit

## 2025-08-11 ENCOUNTER — Other Ambulatory Visit

## 2025-08-18 ENCOUNTER — Encounter: Admitting: Family Medicine
